# Patient Record
Sex: Female | Born: 1945
Health system: Southern US, Community
[De-identification: ages and names within clinical notes are randomized; demographics above are authoritative.]

## PROBLEM LIST (undated history)

## (undated) DIAGNOSIS — K589 Irritable bowel syndrome without diarrhea: Secondary | ICD-10-CM

## (undated) DIAGNOSIS — M1990 Unspecified osteoarthritis, unspecified site: Secondary | ICD-10-CM

## (undated) DIAGNOSIS — I499 Cardiac arrhythmia, unspecified: Secondary | ICD-10-CM

## (undated) DIAGNOSIS — K760 Fatty (change of) liver, not elsewhere classified: Secondary | ICD-10-CM

## (undated) DIAGNOSIS — K449 Diaphragmatic hernia without obstruction or gangrene: Secondary | ICD-10-CM

## (undated) DIAGNOSIS — Z8719 Personal history of other diseases of the digestive system: Secondary | ICD-10-CM

## (undated) DIAGNOSIS — E119 Type 2 diabetes mellitus without complications: Secondary | ICD-10-CM

## (undated) DIAGNOSIS — K279 Peptic ulcer, site unspecified, unspecified as acute or chronic, without hemorrhage or perforation: Secondary | ICD-10-CM

## (undated) DIAGNOSIS — E559 Vitamin D deficiency, unspecified: Secondary | ICD-10-CM

## (undated) DIAGNOSIS — F419 Anxiety disorder, unspecified: Secondary | ICD-10-CM

## (undated) DIAGNOSIS — D126 Benign neoplasm of colon, unspecified: Secondary | ICD-10-CM

## (undated) DIAGNOSIS — I4891 Unspecified atrial fibrillation: Secondary | ICD-10-CM

## (undated) DIAGNOSIS — N189 Chronic kidney disease, unspecified: Secondary | ICD-10-CM

## (undated) DIAGNOSIS — K648 Other hemorrhoids: Secondary | ICD-10-CM

## (undated) DIAGNOSIS — K219 Gastro-esophageal reflux disease without esophagitis: Secondary | ICD-10-CM

## (undated) DIAGNOSIS — Z8489 Family history of other specified conditions: Secondary | ICD-10-CM

## (undated) DIAGNOSIS — M26609 Unspecified temporomandibular joint disorder, unspecified side: Secondary | ICD-10-CM

## (undated) DIAGNOSIS — M67912 Unspecified disorder of synovium and tendon, left shoulder: Secondary | ICD-10-CM

## (undated) DIAGNOSIS — K8020 Calculus of gallbladder without cholecystitis without obstruction: Secondary | ICD-10-CM

## (undated) DIAGNOSIS — E785 Hyperlipidemia, unspecified: Secondary | ICD-10-CM

## (undated) DIAGNOSIS — F32A Depression, unspecified: Secondary | ICD-10-CM

## (undated) HISTORY — PX: POLYPECTOMY: SHX149

## (undated) HISTORY — DX: Hyperlipidemia, unspecified: E78.5

## (undated) HISTORY — PX: BREAST LUMPECTOMY: SHX2

## (undated) HISTORY — DX: Benign neoplasm of colon, unspecified: D12.6

## (undated) HISTORY — DX: Other hemorrhoids: K64.8

## (undated) HISTORY — PX: KNEE SURGERY: SHX244

## (undated) HISTORY — DX: Diaphragmatic hernia without obstruction or gangrene: K44.9

## (undated) HISTORY — DX: Irritable bowel syndrome, unspecified: K58.9

## (undated) HISTORY — DX: Fatty (change of) liver, not elsewhere classified: K76.0

## (undated) HISTORY — PX: TONSILLECTOMY: SUR1361

## (undated) HISTORY — PX: CHOLECYSTECTOMY: SHX55

## (undated) HISTORY — PX: COLONOSCOPY: SHX174

## (undated) HISTORY — DX: Unspecified temporomandibular joint disorder, unspecified side: M26.609

## (undated) HISTORY — PX: OTHER SURGICAL HISTORY: SHX169

## (undated) HISTORY — PX: HAND SURGERY: SHX662

## (undated) HISTORY — DX: Calculus of gallbladder without cholecystitis without obstruction: K80.20

## (undated) HISTORY — DX: Unspecified atrial fibrillation: I48.91

## (undated) HISTORY — DX: Unspecified osteoarthritis, unspecified site: M19.90

## (undated) HISTORY — DX: Peptic ulcer, site unspecified, unspecified as acute or chronic, without hemorrhage or perforation: K27.9

## (undated) HISTORY — DX: Type 2 diabetes mellitus without complications: E11.9

## (undated) HISTORY — DX: Unspecified disorder of synovium and tendon, left shoulder: M67.912

## (undated) HISTORY — DX: Cardiac arrhythmia, unspecified: I49.9

## (undated) HISTORY — DX: Vitamin D deficiency, unspecified: E55.9

---

## 1981-09-02 HISTORY — PX: PARTIAL HYSTERECTOMY: SHX80

## 1998-05-11 ENCOUNTER — Other Ambulatory Visit: Admission: RE | Admit: 1998-05-11 | Discharge: 1998-05-11 | Payer: Self-pay | Admitting: *Deleted

## 2001-09-02 DIAGNOSIS — D126 Benign neoplasm of colon, unspecified: Secondary | ICD-10-CM

## 2001-09-02 HISTORY — DX: Benign neoplasm of colon, unspecified: D12.6

## 2002-01-08 ENCOUNTER — Emergency Department (HOSPITAL_COMMUNITY): Admission: EM | Admit: 2002-01-08 | Discharge: 2002-01-09 | Payer: Self-pay | Admitting: Emergency Medicine

## 2002-01-09 ENCOUNTER — Encounter: Payer: Self-pay | Admitting: Emergency Medicine

## 2005-06-25 ENCOUNTER — Ambulatory Visit: Payer: Self-pay | Admitting: Internal Medicine

## 2005-08-06 ENCOUNTER — Ambulatory Visit: Payer: Self-pay | Admitting: Internal Medicine

## 2005-08-14 ENCOUNTER — Ambulatory Visit: Payer: Self-pay | Admitting: Internal Medicine

## 2005-10-28 ENCOUNTER — Encounter: Admission: RE | Admit: 2005-10-28 | Discharge: 2005-10-28 | Payer: Self-pay | Admitting: Family Medicine

## 2006-01-09 ENCOUNTER — Ambulatory Visit (HOSPITAL_BASED_OUTPATIENT_CLINIC_OR_DEPARTMENT_OTHER): Admission: RE | Admit: 2006-01-09 | Discharge: 2006-01-09 | Payer: Self-pay | Admitting: Orthopedic Surgery

## 2006-01-09 ENCOUNTER — Encounter (INDEPENDENT_AMBULATORY_CARE_PROVIDER_SITE_OTHER): Payer: Self-pay | Admitting: *Deleted

## 2007-01-07 ENCOUNTER — Encounter: Admission: RE | Admit: 2007-01-07 | Discharge: 2007-01-07 | Payer: Self-pay | Admitting: Family Medicine

## 2007-01-12 ENCOUNTER — Encounter: Admission: RE | Admit: 2007-01-12 | Discharge: 2007-01-12 | Payer: Self-pay | Admitting: Family Medicine

## 2008-01-21 ENCOUNTER — Encounter: Admission: RE | Admit: 2008-01-21 | Discharge: 2008-01-21 | Payer: Self-pay | Admitting: Family Medicine

## 2008-02-01 ENCOUNTER — Encounter: Admission: RE | Admit: 2008-02-01 | Discharge: 2008-02-01 | Payer: Self-pay | Admitting: Family Medicine

## 2009-05-10 ENCOUNTER — Encounter: Admission: RE | Admit: 2009-05-10 | Discharge: 2009-05-10 | Payer: Self-pay | Admitting: Family Medicine

## 2009-05-22 ENCOUNTER — Encounter: Admission: RE | Admit: 2009-05-22 | Discharge: 2009-05-22 | Payer: Self-pay | Admitting: Family Medicine

## 2010-01-01 ENCOUNTER — Encounter: Admission: RE | Admit: 2010-01-01 | Discharge: 2010-01-01 | Payer: Self-pay | Admitting: Family Medicine

## 2010-07-24 ENCOUNTER — Encounter: Admission: RE | Admit: 2010-07-24 | Discharge: 2010-07-24 | Payer: Self-pay | Admitting: Family Medicine

## 2010-09-10 ENCOUNTER — Encounter: Payer: Self-pay | Admitting: Internal Medicine

## 2010-09-23 ENCOUNTER — Encounter: Payer: Self-pay | Admitting: Family Medicine

## 2010-10-04 NOTE — Letter (Signed)
Summary: Colonoscopy Letter  [facility]  [address] [place], [place] [address]   Phone: [phone]  Fax: [phone]      September 10, 2010 MRN: [id number]   Dorothy Mcdonald [address] [place], [place]  [address]   Dear Ms. Faires,   According to your medical record, it is time for you to schedule a Colonoscopy. The American Cancer Society recommends this procedure as a method to detect early colon cancer. Patients with a family history of colon cancer, or a personal history of colon polyps or inflammatory bowel disease are at increased risk.  This letter has been generated based on the recommendations made at the time of your procedure. If you feel that in your particular situation this may no longer apply, please contact our office.  Please call our office at [phone] to schedule this appointment or to update your records at your earliest convenience.  Thank you for cooperating with [place] to provide you with the very best care possible.   Sincerely,  Hedwig Morton. Juanda Chance, M.D.  [facility] [phone]

## 2011-01-18 NOTE — Op Note (Signed)
Dorothy Mcdonald, Dorothy Mcdonald               ACCOUNT NO.:  [id number]   MEDICAL RECORD NO.:  [id number]          PATIENT TYPE:  AMB   LOCATION:  DSC                          FACILITY:  [facility]   PHYSICIAN:  Rodney A. Mortenson, M.D.DATE OF BIRTH:  [birth year]   DATE OF PROCEDURE:  01/09/2006  DATE OF DISCHARGE:                                 OPERATIVE REPORT   JUSTIFICATION:  A 65 year old female with a recurrent triggering finger of  left long finger.  She developed a mass along the flexor sheath.  This is  quite tender and is giving her trouble once again.  She does not wish to  have surgical excision.  She has some symptoms of carpal tunnel but her  nerve conductive studies are negative for carpal tunnel compression.  She  elects not to address the carpal tunnel but just direct to the long finger.   Complications discussed preoperatively, questions answered and encouraged.   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   OPERATION:  Excise cyst from flexor sheath of long finger.   SURGEON:  Lenard Galloway. Chaney Malling, M.D.   ANESTHESIA:  MAC.   PROCEDURE:  The patient was placed on the operative table in the supine  position with a pneumatic tourniquet about the left upper arm.  The left  upper extremity was prepped with DuraPrep and draped down in the usual  manner.  The area of an incision was infiltrated with 1% local Xylocaine.  The arm was then wrapped with an Esmarch, tourniquet was elevated.  Loop  magnification was used throughout.  A Brunner incision made over the flexor  sheath to the long finger.  Skin edges were retracted.  While the digital  nerves were clearly seen in the mid portion of the lumen, this was retracted  and protected out of the operative field.  There is a very large cyst on the  flexor sheath and this is somewhat stenotic in this area.  The sheath was  opened  and a large portion of the sheath was totally removed, which included  the base of the cyst.  Excellent complete decompression of the cyst was  achieved.  The finger could then be moved passively and there was no  catching or locking of the flexor tendons as we moved the sheath.  Skin  edges were then closed with a #4-0 nylon suture.  A large bulky pressure  dressing applied and the patient returned to the recovery room in excellent  condition.  Technically, this procedure went extremely well.   FOLLOWUP CARE:  1.  To the office next week.  2.  Archer Asa and Elita Quick for pain.           ______________________________  Lenard Galloway Chaney Malling, M.D.    RAM/MEDQ  D:  01/09/2006  T:  01/10/2006  Job:  [id number]

## 2011-02-25 ENCOUNTER — Ambulatory Visit (AMBULATORY_SURGERY_CENTER): Payer: BC Managed Care – PPO | Admitting: *Deleted

## 2011-02-25 ENCOUNTER — Encounter: Payer: Self-pay | Admitting: Internal Medicine

## 2011-02-25 VITALS — Ht 65.0 in | Wt 194.2 lb

## 2011-02-25 DIAGNOSIS — Z800 Family history of malignant neoplasm of digestive organs: Secondary | ICD-10-CM

## 2011-02-25 DIAGNOSIS — Z860100 Personal history of colon polyps, unspecified: Secondary | ICD-10-CM

## 2011-02-25 MED ORDER — BISACODYL 5 MG PO TBEC: DELAYED_RELEASE_TABLET | ORAL | Status: DC

## 2011-02-25 MED ORDER — POLYETHYLENE GLYCOL 3350 17 GM/SCOOP PO POWD: 17.0000 g | Freq: Every day | ORAL | Status: AC

## 2011-02-25 MED ORDER — METOCLOPRAMIDE HCL 10 MG PO TABS: ORAL_TABLET | ORAL | Status: DC

## 2011-02-25 NOTE — Progress Notes (Signed)
PATIENT IS REFUSING ALL LARGE VOLUME PREPS SAID SHE WILL THROW THEM UP. SHE REFUSED MOVIPREP AND DID NOT WANT TO CONSENT TO OSMOPREP. WHEN I OFFERED MIRALAX PREP SHE AGREED. STATES SHE DID THAT LAST TIME AND DID FINE. Sherren Kerns

## 2011-03-11 ENCOUNTER — Encounter: Payer: Self-pay | Admitting: Internal Medicine

## 2011-03-11 ENCOUNTER — Ambulatory Visit (AMBULATORY_SURGERY_CENTER): Payer: BC Managed Care – PPO | Admitting: Internal Medicine

## 2011-03-11 DIAGNOSIS — Z86010 Personal history of colonic polyps: Secondary | ICD-10-CM

## 2011-03-11 DIAGNOSIS — D126 Benign neoplasm of colon, unspecified: Secondary | ICD-10-CM

## 2011-03-11 DIAGNOSIS — Z1211 Encounter for screening for malignant neoplasm of colon: Secondary | ICD-10-CM

## 2011-03-11 DIAGNOSIS — Z800 Family history of malignant neoplasm of digestive organs: Secondary | ICD-10-CM

## 2011-03-11 MED ORDER — SODIUM CHLORIDE 0.9 % IV SOLN: 500.0000 mL | INTRAVENOUS | Status: DC

## 2011-03-11 NOTE — Patient Instructions (Signed)
See you colonoscopy findings on the picture page.  Resume your prior medications today.  Please call if any questions or concerns.

## 2011-03-11 NOTE — Progress Notes (Signed)
No complaints while in recovery. MAW

## 2011-03-12 ENCOUNTER — Telehealth: Payer: Self-pay | Admitting: *Deleted

## 2011-03-12 NOTE — Telephone Encounter (Signed)

## 2011-03-16 ENCOUNTER — Encounter: Payer: Self-pay | Admitting: Internal Medicine

## 2011-05-21 ENCOUNTER — Telehealth: Payer: Self-pay | Admitting: Internal Medicine

## 2011-05-21 NOTE — Telephone Encounter (Signed)
Spoke with patient and gave her the results of colonoscopy. Mailed her the letter again after verifying the address.

## 2011-08-30 ENCOUNTER — Other Ambulatory Visit: Payer: Self-pay | Admitting: Family Medicine

## 2011-08-30 DIAGNOSIS — R920 Mammographic microcalcification found on diagnostic imaging of breast: Secondary | ICD-10-CM

## 2011-09-11 ENCOUNTER — Other Ambulatory Visit: Payer: Self-pay | Admitting: Family Medicine

## 2011-09-11 ENCOUNTER — Ambulatory Visit
Admission: RE | Admit: 2011-09-11 | Discharge: 2011-09-11 | Disposition: A | Discharge status: Home / Self Care | Payer: BC Managed Care – PPO | Source: Ambulatory Visit | Attending: Family Medicine | Admitting: Family Medicine

## 2011-09-11 DIAGNOSIS — R920 Mammographic microcalcification found on diagnostic imaging of breast: Secondary | ICD-10-CM

## 2011-09-11 DIAGNOSIS — N6001 Solitary cyst of right breast: Secondary | ICD-10-CM

## 2012-09-22 ENCOUNTER — Other Ambulatory Visit: Payer: Self-pay | Admitting: Family Medicine

## 2012-09-22 DIAGNOSIS — Z1231 Encounter for screening mammogram for malignant neoplasm of breast: Secondary | ICD-10-CM

## 2012-10-16 ENCOUNTER — Ambulatory Visit: Payer: BC Managed Care – PPO

## 2012-11-10 ENCOUNTER — Ambulatory Visit
Admission: RE | Admit: 2012-11-10 | Discharge: 2012-11-10 | Disposition: A | Discharge status: Home / Self Care | Payer: BC Managed Care – PPO | Source: Ambulatory Visit | Attending: Family Medicine | Admitting: Family Medicine

## 2012-11-10 DIAGNOSIS — Z1231 Encounter for screening mammogram for malignant neoplasm of breast: Secondary | ICD-10-CM

## 2013-12-29 ENCOUNTER — Other Ambulatory Visit: Payer: Self-pay

## 2013-12-29 DIAGNOSIS — Z1231 Encounter for screening mammogram for malignant neoplasm of breast: Secondary | ICD-10-CM

## 2014-01-06 ENCOUNTER — Ambulatory Visit: Payer: BC Managed Care – PPO

## 2014-01-17 ENCOUNTER — Encounter (INDEPENDENT_AMBULATORY_CARE_PROVIDER_SITE_OTHER): Payer: Self-pay

## 2014-01-17 ENCOUNTER — Ambulatory Visit
Admission: RE | Admit: 2014-01-17 | Discharge: 2014-01-17 | Disposition: A | Discharge status: Home / Self Care | Payer: Medicare Other | Source: Ambulatory Visit

## 2014-01-17 DIAGNOSIS — Z1231 Encounter for screening mammogram for malignant neoplasm of breast: Secondary | ICD-10-CM

## 2014-03-11 ENCOUNTER — Other Ambulatory Visit: Payer: Self-pay | Admitting: Family Medicine

## 2014-03-11 DIAGNOSIS — R1011 Right upper quadrant pain: Secondary | ICD-10-CM

## 2014-03-14 ENCOUNTER — Ambulatory Visit
Admission: RE | Admit: 2014-03-14 | Discharge: 2014-03-14 | Disposition: A | Discharge status: Home / Self Care | Payer: Medicare Other | Source: Ambulatory Visit | Attending: Family Medicine | Admitting: Family Medicine

## 2014-03-14 DIAGNOSIS — R1011 Right upper quadrant pain: Secondary | ICD-10-CM

## 2014-03-17 ENCOUNTER — Encounter: Payer: Self-pay | Admitting: Internal Medicine

## 2014-03-18 ENCOUNTER — Encounter: Payer: Self-pay | Admitting: *Deleted

## 2014-04-23 ENCOUNTER — Encounter: Payer: Self-pay | Admitting: Internal Medicine

## 2014-05-31 ENCOUNTER — Ambulatory Visit (INDEPENDENT_AMBULATORY_CARE_PROVIDER_SITE_OTHER): Payer: Medicare Other | Admitting: Internal Medicine

## 2014-05-31 ENCOUNTER — Encounter: Payer: Self-pay | Admitting: Internal Medicine

## 2014-05-31 ENCOUNTER — Other Ambulatory Visit (INDEPENDENT_AMBULATORY_CARE_PROVIDER_SITE_OTHER): Payer: Medicare Other

## 2014-05-31 VITALS — BP 126/78 | HR 68 | Ht 64.75 in | Wt 190.0 lb

## 2014-05-31 DIAGNOSIS — K760 Fatty (change of) liver, not elsewhere classified: Secondary | ICD-10-CM

## 2014-05-31 DIAGNOSIS — R1011 Right upper quadrant pain: Secondary | ICD-10-CM

## 2014-05-31 DIAGNOSIS — K7689 Other specified diseases of liver: Secondary | ICD-10-CM

## 2014-05-31 LAB — CREATININE, SERUM: Creatinine, Ser: 0.8 mg/dL (ref 0.4–1.2)

## 2014-05-31 LAB — HEPATIC FUNCTION PANEL
ALBUMIN: 3.9 g/dL (ref 3.5–5.2)
ALK PHOS: 83 U/L (ref 39–117)
ALT: 22 U/L (ref 0–35)
AST: 24 U/L (ref 0–37)
BILIRUBIN DIRECT: 0 mg/dL (ref 0.0–0.3)
Total Bilirubin: 0.5 mg/dL (ref 0.2–1.2)
Total Protein: 7.3 g/dL (ref 6.0–8.3)

## 2014-05-31 LAB — BUN: BUN: 21 mg/dL (ref 6–23)

## 2014-05-31 MED ORDER — RANITIDINE HCL 300 MG PO TABS: 300.0000 mg | ORAL_TABLET | Freq: Every day | ORAL | Status: DC

## 2014-05-31 NOTE — Patient Instructions (Addendum)
You have been scheduled for a CT scan of the abdomen and pelvis at [facility] ([address]---this is in the same building as Press [facility]).   You are scheduled on 06/09/14 at 8:30 am. You should arrive 15 minutes prior to your appointment time for registration. Please follow the written instructions below on the day of your exam:  WARNING: IF YOU ARE ALLERGIC TO IODINE/X-RAY DYE, PLEASE NOTIFY RADIOLOGY IMMEDIATELY AT [phone]! YOU WILL BE GIVEN A 13 HOUR PREMEDICATION PREP.  1) Do not eat or drink anything after 4:30 am (4 hours prior to your test) 2) You have been given 2 bottles of oral contrast to drink. The solution may taste better if refrigerated, but do NOT add ice or any other liquid to this solution. Shake well before drinking.    Drink 1 bottle of contrast @ 6:30 am (2 hours prior to your exam)  Drink 1 bottle of contrast @ 7:30 am (1 hour prior to your exam)  You may take any medications as prescribed with a small amount of water except for the following: Metformin, Glucophage, Glucovance, Avandamet, Riomet, Fortamet, Actoplus Met, Janumet, Glumetza or Metaglip. The above medications must be held the day of the exam AND 48 hours after the exam.  The purpose of you drinking the oral contrast is to aid in the visualization of your intestinal tract. The contrast solution may cause some diarrhea. Before your exam is started, you will be given a small amount of fluid to drink. Depending on your individual set of symptoms, you may also receive an intravenous injection of x-ray contrast/dye. Plan on being at [facility] for 30 minutes or long, depending on the type of exam you are having performed.  This test typically takes 30-45 minutes to complete.  If you have any questions regarding your exam or if you need to reschedule, you may call the CT department at [phone] between the hours of 8:00 am and 5:00 pm,  Monday-Friday.  ________________________________________________________________________ We have sent the following medications to your pharmacy for you to pick up at your convenience: Ranitidine  Your physician has requested that you go to the basement for the following lab work before leaving today: LFT's, BUN, Creatinine  CC:Dr Claris Gower

## 2014-05-31 NOTE — Progress Notes (Signed)
Dorothy Mcdonald 07-13-1946 [id number]  Note: This dictation was prepared with Dragon digital system. Any transcriptional errors that result from this procedure are unintentional.   History of Present Illness:  This is a 68 year old white female with chronic right upper quadrant abdominal pain which started shortly after her laparoscopic cholecystectomy about 7 years ago. She has a chronically inflamed gallbladder but otherwise, an uncomplicated hospitalization. The pain is worse when she is constipated or after a large meal. She has known fatty liver on an upper abdominal ultrasound from July 2015. She denies heartburn or indigestion. She denies taking any laxatives but complains of not being able to evacuate, having pasty soft stools. She had a colonoscopy in 2003 when she had a tubular adenoma and hyperplastic polyp removed.. In 2006, she had a normal exam and in July 2012, she had a cecal tubular adenoma. Her father had colon cancer.    Past Medical History  Diagnosis Date  . Irregular heart beat   . Hyperlipidemia   . Vitamin D deficiency   . Adenomatous colon polyp 2003  . Hepatic steatosis   . TMJ (temporomandibular joint disorder)   . Arthritis   . Gallstones   . IBS (irritable bowel syndrome)     Past Surgical History  Procedure Laterality Date  . Polypectomy    . Colonoscopy    . Cholecystectomy    . Partial hysterectomy  1983  . Rectocele and cystocele repair    . Breast lumpectomy Right   . Tonsillectomy      No Known Allergies  Family history and social history have been reviewed.  Review of Systems: Right upper quadrant abdominal pain  The remainder of the 10 point ROS is negative except as outlined in the H&P  Physical Exam: General Appearance Well developed, in no distress, mildly overweight Eyes  Non icteric  HEENT  Non traumatic, normocephalic  Mouth No lesion, tongue papillated, no cheilosis Neck Supple without adenopathy, thyroid not enlarged, no  carotid bruits, no JVD Lungs Clear to auscultation bilaterally COR Normal S1, normal S2, regular rhythm, no murmur, quiet precordium Abdomen soft but tender in the small area along right costal margin rather tender on palpation. I cannot appreciate any hernia. Liver edge is at the costal margin. There is no ascites. Bowel sounds are active Rectal small amount of soft Hemoccult negative stool Extremities  No pedal edema Skin No lesions Neurological Alert and oriented x 3 Psychological Normal mood and affect  Assessment and Plan:   Problem #93 68 year old white female with chronic right upper quadrant abdominal discomfort localized to a small area along the right costal margin which could be originating from the abdominal wall but also could be due to adhesions or possibly due to a ventral hernia. We will proceed with a CT scan of the abdomen with attention to the right upper quadrant. I will also start her on ranitidine 300 mg at bedtime because of her dyspepsia and associated nausea. Because of the constipation, she will start a probiotic, one daily. She will be due for a recall colonoscopy in July 2017. We will check liver function tests today.  Problem #2 Positive family history of colon cancer in patient's father. A recall colonoscopy will be due in July 2017.    Delfin Edis 05/31/2014

## 2014-06-09 ENCOUNTER — Telehealth: Payer: Self-pay | Admitting: *Deleted

## 2014-06-09 ENCOUNTER — Ambulatory Visit (INDEPENDENT_AMBULATORY_CARE_PROVIDER_SITE_OTHER)
Admission: RE | Admit: 2014-06-09 | Discharge: 2014-06-09 | Disposition: A | Discharge status: Home / Self Care | Payer: Medicare Other | Source: Ambulatory Visit | Attending: Internal Medicine | Admitting: Internal Medicine

## 2014-06-09 DIAGNOSIS — R1011 Right upper quadrant pain: Secondary | ICD-10-CM

## 2014-06-09 MED ORDER — IOHEXOL 300 MG/ML  SOLN
100.0000 mL | Freq: Once | INTRAMUSCULAR | Status: AC | PRN
  Administered 2014-06-09: 100 mL via INTRAVENOUS

## 2014-06-09 NOTE — Telephone Encounter (Signed)
Lattie Haw with [facility] called to let us know patient was able to drink some of the water based contrast and they went ahead with the CT. They did the best they could.

## 2014-06-09 NOTE — Telephone Encounter (Signed)
Lattie Haw with [facility] called to let Dr. Olevia Perches know that this patient vomited her oral contrast. Lattie Haw is going to have the patient try water based contrast and then scan her.

## 2015-07-04 ENCOUNTER — Other Ambulatory Visit: Payer: Self-pay

## 2015-07-04 DIAGNOSIS — Z1231 Encounter for screening mammogram for malignant neoplasm of breast: Secondary | ICD-10-CM

## 2015-07-07 ENCOUNTER — Ambulatory Visit: Payer: Self-pay

## 2015-08-03 ENCOUNTER — Ambulatory Visit
Admission: RE | Admit: 2015-08-03 | Discharge: 2015-08-03 | Disposition: A | Discharge status: Home / Self Care | Payer: Medicare Other | Source: Ambulatory Visit

## 2015-08-03 DIAGNOSIS — Z1231 Encounter for screening mammogram for malignant neoplasm of breast: Secondary | ICD-10-CM

## 2016-01-31 ENCOUNTER — Other Ambulatory Visit: Payer: Self-pay | Admitting: Family Medicine

## 2016-01-31 ENCOUNTER — Other Ambulatory Visit: Payer: Self-pay

## 2016-01-31 ENCOUNTER — Ambulatory Visit (HOSPITAL_COMMUNITY): Payer: Medicare Other | Attending: Cardiovascular Disease

## 2016-01-31 DIAGNOSIS — I482 Chronic atrial fibrillation, unspecified: Secondary | ICD-10-CM

## 2016-01-31 DIAGNOSIS — I517 Cardiomegaly: Secondary | ICD-10-CM | POA: Insufficient documentation

## 2016-01-31 DIAGNOSIS — I4891 Unspecified atrial fibrillation: Secondary | ICD-10-CM | POA: Diagnosis present

## 2016-01-31 DIAGNOSIS — Z87891 Personal history of nicotine dependence: Secondary | ICD-10-CM | POA: Insufficient documentation

## 2016-01-31 DIAGNOSIS — E785 Hyperlipidemia, unspecified: Secondary | ICD-10-CM | POA: Insufficient documentation

## 2016-01-31 DIAGNOSIS — I4892 Unspecified atrial flutter: Secondary | ICD-10-CM | POA: Insufficient documentation

## 2016-02-14 ENCOUNTER — Encounter: Payer: Self-pay | Admitting: Gastroenterology

## 2016-03-19 ENCOUNTER — Ambulatory Visit (HOSPITAL_BASED_OUTPATIENT_CLINIC_OR_DEPARTMENT_OTHER): Payer: Medicare Other | Attending: Family Medicine | Admitting: Internal Medicine

## 2016-03-19 VITALS — Ht 65.0 in | Wt 197.0 lb

## 2016-03-19 DIAGNOSIS — Z6833 Body mass index (BMI) 33.0-33.9, adult: Secondary | ICD-10-CM | POA: Diagnosis not present

## 2016-03-19 DIAGNOSIS — E669 Obesity, unspecified: Secondary | ICD-10-CM | POA: Diagnosis not present

## 2016-03-19 DIAGNOSIS — R5383 Other fatigue: Secondary | ICD-10-CM | POA: Insufficient documentation

## 2016-03-19 DIAGNOSIS — I4891 Unspecified atrial fibrillation: Secondary | ICD-10-CM | POA: Insufficient documentation

## 2016-03-19 DIAGNOSIS — Z79899 Other long term (current) drug therapy: Secondary | ICD-10-CM | POA: Diagnosis not present

## 2016-03-19 DIAGNOSIS — R0683 Snoring: Secondary | ICD-10-CM | POA: Insufficient documentation

## 2016-03-24 DIAGNOSIS — I4891 Unspecified atrial fibrillation: Secondary | ICD-10-CM | POA: Diagnosis not present

## 2016-03-24 NOTE — Procedures (Signed)
   Patient Name: Dorothy Mcdonald, Dorothy Mcdonald Date: 03/19/2016 Gender: Female D.O.B: 1946/06/27 Age (years): 69 Referring Provider: Claris Gower Height (inches): 54 Interpreting Physician: Baird Lyons MD, ABSM Weight (lbs): 197 RPSGT: Laren Everts BMI: 33 MRN: [id number] Neck Size: 13.00 CLINICAL INFORMATION Sleep Study Type: NPSG Indication for sleep study: Fatigue, Obesity Epworth Sleepiness Score: 8 SLEEP STUDY TECHNIQUE As per the AASM Manual for the Scoring of Sleep and Associated Events v2.3 (April 2016) with a hypopnea requiring 4% desaturations. The channels recorded and monitored were frontal, central and occipital EEG, electrooculogram (EOG), submentalis EMG (chin), nasal and oral airflow, thoracic and abdominal wall motion, anterior tibialis EMG, snore microphone, electrocardiogram, and pulse oximetry. MEDICATIONS Patient's medications include: charted for review Medications self-administered by patient during sleep study : DIPHENHYDRAMINE. SLEEP ARCHITECTURE The study was initiated at 10:43:13 PM and ended at 4:53:15 AM. Sleep onset time was 50.1 minutes and the sleep efficiency was 57.6%. The total sleep time was 213.0 minutes. Stage REM latency was N/A minutes. The patient spent 15.02% of the night in stage N1 sleep, 84.98% in stage N2 sleep, 0.00% in stage N3 and 0.00% in REM. Alpha intrusion was absent. Supine sleep was 25.12%. Wake after sleep onset 107 minutes RESPIRATORY PARAMETERS The overall apnea/hypopnea index (AHI) was 0.8 per hour. There were 3 total apneas, including 1 obstructive, 2 central and 0 mixed apneas. There were 0 hypopneas and 17 RERAs. The AHI during Stage REM sleep was N/A per hour. AHI while supine was 2.2 per hour. The mean oxygen saturation was 95.91%. The minimum SpO2 during sleep was 92.00%. Soft snoring was noted during this study. CARDIAC DATA The 2 lead EKG demonstrated sinus rhythm. The mean heart rate was 58.52 beats per minute.  Other EKG findings include: PACs. LEG MOVEMENT DATA The total PLMS were 0 with a resulting PLMS index of 0.00. Associated arousal with leg movement index was 0.0 . IMPRESSIONS - No significant obstructive sleep apnea occurred during this study (AHI = 0.8/h). - No significant central sleep apnea occurred during this study (CAI = 0.6/h). - The patient had minimal or no oxygen desaturation during the study (Min O2 = 92.00%) - The patient snored with Soft snoring volume. - No cardiac abnormalities were noted during this study. Rhythm was sinus with occasional PAC. - Clinically significant periodic limb movements did not occur during sleep. No significant associated arousals. - test 2 of template DIAGNOSIS - Normal study RECOMMENDATIONS - Avoid alcohol, sedatives and other CNS depressants that may worsen sleep apnea and disrupt normal sleep architecture. - Sleep hygiene should be reviewed to assess factors that may improve sleep quality. - Weight management and regular exercise should be initiated or continued if appropriate.  [Electronically signed] 03/24/2016 05:05 PM  Baird Lyons MD, [place], American Board of Sleep Medicine   NPI: [id number]  [place], American Board of Sleep Medicine  ELECTRONICALLY SIGNED ON:  03/24/2016, 5:02 PM [place] PH: [phone]   FX: [phone] [place]

## 2016-08-31 ENCOUNTER — Encounter (HOSPITAL_COMMUNITY): Payer: Self-pay | Admitting: Emergency Medicine

## 2016-08-31 ENCOUNTER — Emergency Department (HOSPITAL_COMMUNITY)
Admission: EM | Admit: 2016-08-31 | Discharge: 2016-08-31 | Disposition: A | Discharge status: Home / Self Care | Payer: Medicare Other | Attending: Emergency Medicine | Admitting: Emergency Medicine

## 2016-08-31 ENCOUNTER — Emergency Department (HOSPITAL_COMMUNITY): Payer: Medicare Other

## 2016-08-31 DIAGNOSIS — R1013 Epigastric pain: Secondary | ICD-10-CM | POA: Diagnosis present

## 2016-08-31 DIAGNOSIS — Z7982 Long term (current) use of aspirin: Secondary | ICD-10-CM | POA: Diagnosis not present

## 2016-08-31 DIAGNOSIS — K279 Peptic ulcer, site unspecified, unspecified as acute or chronic, without hemorrhage or perforation: Secondary | ICD-10-CM | POA: Diagnosis not present

## 2016-08-31 DIAGNOSIS — K219 Gastro-esophageal reflux disease without esophagitis: Secondary | ICD-10-CM

## 2016-08-31 DIAGNOSIS — Z7901 Long term (current) use of anticoagulants: Secondary | ICD-10-CM | POA: Insufficient documentation

## 2016-08-31 DIAGNOSIS — Z87891 Personal history of nicotine dependence: Secondary | ICD-10-CM | POA: Diagnosis not present

## 2016-08-31 LAB — COMPREHENSIVE METABOLIC PANEL
ALT: 22 U/L (ref 14–54)
ANION GAP: 3 — AB (ref 5–15)
AST: 24 U/L (ref 15–41)
Albumin: 3.3 g/dL — ABNORMAL LOW (ref 3.5–5.0)
Alkaline Phosphatase: 85 U/L (ref 38–126)
BUN: 14 mg/dL (ref 6–20)
CHLORIDE: 111 mmol/L (ref 101–111)
CO2: 26 mmol/L (ref 22–32)
CREATININE: 0.76 mg/dL (ref 0.44–1.00)
Calcium: 9 mg/dL (ref 8.9–10.3)
Glucose, Bld: 111 mg/dL — ABNORMAL HIGH (ref 65–99)
POTASSIUM: 3.6 mmol/L (ref 3.5–5.1)
SODIUM: 140 mmol/L (ref 135–145)
Total Bilirubin: 0.3 mg/dL (ref 0.3–1.2)
Total Protein: 6.7 g/dL (ref 6.5–8.1)

## 2016-08-31 LAB — URINALYSIS, ROUTINE W REFLEX MICROSCOPIC
Bacteria, UA: NONE SEEN
Bilirubin Urine: NEGATIVE
GLUCOSE, UA: NEGATIVE mg/dL
KETONES UR: NEGATIVE mg/dL
Nitrite: NEGATIVE
PH: 5 (ref 5.0–8.0)
Protein, ur: NEGATIVE mg/dL
Specific Gravity, Urine: 1.031 — ABNORMAL HIGH (ref 1.005–1.030)

## 2016-08-31 LAB — CBC
HCT: 43.8 % (ref 36.0–46.0)
HEMOGLOBIN: 14.8 g/dL (ref 12.0–15.0)
MCH: 29.5 pg (ref 26.0–34.0)
MCHC: 33.8 g/dL (ref 30.0–36.0)
MCV: 87.3 fL (ref 78.0–100.0)
PLATELETS: 245 10*3/uL (ref 150–400)
RBC: 5.02 MIL/uL (ref 3.87–5.11)
RDW: 13.5 % (ref 11.5–15.5)
WBC: 11.4 10*3/uL — AB (ref 4.0–10.5)

## 2016-08-31 LAB — LIPASE, BLOOD: LIPASE: 20 U/L (ref 11–51)

## 2016-08-31 LAB — PROTIME-INR
INR: 2.22
Prothrombin Time: 25 seconds — ABNORMAL HIGH (ref 11.4–15.2)

## 2016-08-31 MED ORDER — FAMOTIDINE IN NACL 20-0.9 MG/50ML-% IV SOLN
20.0000 mg | Freq: Once | INTRAVENOUS | Status: AC
  Administered 2016-08-31: 20 mg via INTRAVENOUS
  Filled 2016-08-31: qty 50

## 2016-08-31 MED ORDER — SODIUM CHLORIDE 0.9 % IV BOLUS (SEPSIS)
1000.0000 mL | Freq: Once | INTRAVENOUS | Status: AC
  Administered 2016-08-31: 1000 mL via INTRAVENOUS

## 2016-08-31 MED ORDER — FAMOTIDINE 20 MG PO TABS: 20.0000 mg | ORAL_TABLET | Freq: Two times a day (BID) | ORAL | 0 refills | Status: DC

## 2016-08-31 MED ORDER — ONDANSETRON HCL 4 MG/2ML IJ SOLN
4.0000 mg | Freq: Once | INTRAMUSCULAR | Status: AC
Start: 2016-08-31 — End: 2016-08-31
  Administered 2016-08-31: 4 mg via INTRAVENOUS
  Filled 2016-08-31: qty 2

## 2016-08-31 MED ORDER — SUCRALFATE 1 G PO TABS: 1.0000 g | ORAL_TABLET | Freq: Three times a day (TID) | ORAL | 0 refills | Status: DC

## 2016-08-31 MED ORDER — ONDANSETRON HCL 4 MG PO TABS: 4.0000 mg | ORAL_TABLET | Freq: Four times a day (QID) | ORAL | 0 refills | Status: DC

## 2016-08-31 NOTE — ED Notes (Signed)
Pt. Requesting some ice chips, stated, no vomiting just some acid reflux

## 2016-08-31 NOTE — ED Provider Notes (Signed)
[facility] DEPT Provider Note   CSN: [id number] Arrival date & time: 08/31/16  [id number]     History   Chief Complaint Chief Complaint  Patient presents with  . Constipation  . Emesis    HPI Dorothy Mcdonald is a 70 y.o. female.  Patient is a 70 year old female with a history of A. fib on chronic Coumadin therapy, IBS presenting today with 6 days of persistent epigastric pain with nausea and intermittent vomiting. Patient states on Christmas  is when her symptoms started however they have not improved.  She initially felt that she was constipated however she took a laxative on Tuesday and had a very large bowel movement. She also had an episode of emesis on Tuesday. Since that time she's had constant epigastric discomfort, persistent nausea but only occasional vomiting. Patient last vomited last night and noted she had blood in her vomitus. It was not all blood but there were clots of blood in it. She is also had a very foul taste in her mouth which is worst in the morning when she wakes up. She currently takes no medication for reflux and has no prior history of peptic ulcers. She denies any NSAID use and does not drink alcohol regularly. Eating seems to make the pain a little bit worse. She has never had an endoscopy done but sounds like she had an upper GI done about a year ago which was normal. She has really not had any symptoms like this for about a year and they have never been this severe.  This is also the first episode of hematemesis and no bowel movement since Tuesday the patient has only had minimal to eat. She is still passing gas. She also notices her urine is dark but denies significant frequency, urgency or dysuria..   The history is provided by the patient.  Constipation    Emesis      Past Medical History:  Diagnosis Date  . Adenomatous colon polyp 2003  . Arthritis   . Gallstones   . Hepatic steatosis   . Hyperlipidemia   . IBS (irritable bowel syndrome)   .  Irregular heart beat   . TMJ (temporomandibular joint disorder)   . Vitamin D deficiency     There are no active problems to display for this patient.   Past Surgical History:  Procedure Laterality Date  . BREAST LUMPECTOMY Right   . CHOLECYSTECTOMY    . COLONOSCOPY    . PARTIAL HYSTERECTOMY  1983  . POLYPECTOMY    . RECTOCELE AND CYSTOCELE REPAIR    . TONSILLECTOMY      OB History    No data available       Home Medications    Prior to Admission medications   Medication Sig Start Date End Date Taking? Authorizing Provider  atenolol (TENORMIN) 50 MG tablet Take 25 mg by mouth 2 (two) times daily.   Yes Historical Provider, MD  Magnesium 100 MG CAPS Take 100 mg by mouth daily.   Yes Historical Provider, MD  ranitidine (ZANTAC) 300 MG tablet Take 1 tablet (300 mg total) by mouth at bedtime. 05/31/14  Yes Lafayette Dragon, MD  topiramate (TOPAMAX) 100 MG tablet Take 100 mg by mouth daily.   Yes Historical Provider, MD  Vitamin D, Ergocalciferol, (DRISDOL) 50000 units CAPS capsule Take 50,000 Units by mouth every 7 (seven) days. Saturday   Yes Historical Provider, MD  warfarin (COUMADIN) 6 MG tablet Take 6 mg by mouth daily.   Yes  Historical Provider, MD  aspirin 500 MG tablet Take 500 mg by mouth 2 (two) times daily. FOR TMJ    Historical Provider, MD  aspirin 81 MG tablet Take 81 mg by mouth daily.    Historical Provider, MD  Cinnamon 500 MG TABS Take 2 tablets by mouth daily.    Historical Provider, MD  phentermine 37.5 MG capsule Take 37.5 mg by mouth daily. Takes 1/2-1 daily    Historical Provider, MD  propranolol (INDERAL) 80 MG tablet Take 40-80 mg by mouth every 8 (eight) hours. Pt takes 1 tablet at night 02/25/14   Historical Provider, MD    Family History Family History  Problem Relation Age of Onset  . Colon cancer Father     DIED AT 64  . Colon cancer Paternal Aunt   . Colon cancer Paternal Uncle     x 3  . Ovarian cancer Mother   . Uterine cancer Mother   .  Breast cancer Sister     Social History Social History  Substance Use Topics  . Smoking status: Former Smoker    Types: Cigarettes    Quit date: 09/02/1984  . Smokeless tobacco: Never Used  . Alcohol use Yes     Comment: MONTHLY     Allergies   Penicillins   Review of Systems Review of Systems  Gastrointestinal: Positive for constipation and vomiting.  All other systems reviewed and are negative.    Physical Exam Updated Vital Signs BP 164/74 (BP Location: Left Arm)   Pulse 95   Temp 97.9 F (36.6 C) (Oral)   Resp 16   Ht 5' 5.5" (1.664 m)   Wt 199 lb 11.2 oz (90.6 kg)   SpO2 91%   BMI 32.73 kg/m   Physical Exam  Constitutional: She is oriented to person, place, and time. She appears well-developed and well-nourished. No distress.  HENT:  Head: Normocephalic and atraumatic.  Mouth/Throat: Oropharynx is clear and moist. Mucous membranes are dry.  Eyes: Conjunctivae and EOM are normal. Pupils are equal, round, and reactive to light.  Neck: Normal range of motion. Neck supple.  Cardiovascular: Normal rate, regular rhythm and intact distal pulses.   No murmur heard. Pulmonary/Chest: Effort normal and breath sounds normal. No respiratory distress. She has no wheezes. She has no rales.  Abdominal: Soft. She exhibits no distension. There is tenderness in the epigastric area. There is no rebound and no guarding.  Musculoskeletal: Normal range of motion. She exhibits no edema or tenderness.  Neurological: She is alert and oriented to person, place, and time.  Skin: Skin is warm and dry. No rash noted. No erythema.  Psychiatric: She has a normal mood and affect. Her behavior is normal.  Nursing note and vitals reviewed.    ED Treatments / Results  Labs (all labs ordered are listed, but only abnormal results are displayed) Labs Reviewed  COMPREHENSIVE METABOLIC PANEL - Abnormal; Notable for the following:       Result Value   Glucose, Bld 111 (*)    Albumin 3.3 (*)     Anion gap 3 (*)    All other components within normal limits  CBC - Abnormal; Notable for the following:    WBC 11.4 (*)    All other components within normal limits  URINALYSIS, ROUTINE W REFLEX MICROSCOPIC - Abnormal; Notable for the following:    APPearance HAZY (*)    Specific Gravity, Urine 1.031 (*)    Hgb urine dipstick SMALL (*)  Leukocytes, UA TRACE (*)    Squamous Epithelial / LPF 0-5 (*)    Crystals PRESENT (*)    All other components within normal limits  PROTIME-INR - Abnormal; Notable for the following:    Prothrombin Time 25.0 (*)    All other components within normal limits  LIPASE, BLOOD    EKG  EKG Interpretation None       Radiology Dg Abdomen Acute W/chest  Result Date: 08/31/2016 CLINICAL DATA:  Constipation, emesis and indigestion x5 days. Epigastric pain x5 days. Productive cough in the past week, but pt states it has mostly cleared up. Hx of cholecystectomy, palpitations, gallstones, IBS. EXAM: DG ABDOMEN ACUTE W/ 1V CHEST COMPARISON:  CT, 06/09/2014 FINDINGS: Normal bowel gas pattern. No obstruction, generalized adynamic ileus or free air. Status post cholecystectomy. No evidence of renal or ureteral stones. Heart, mediastinum hila are unremarkable. Lungs are essentially clear. Skeletal structures are intact. Mild-to-moderate lumbar levoscoliosis. IMPRESSION: 1. No acute findings.  No evidence of bowel obstruction or free air. 2. No acute cardiopulmonary disease. Electronically Signed   By: Lajean Manes M.D.   On: 08/31/2016 10:52    Procedures Procedures (including critical care time)  Medications Ordered in ED Medications  ondansetron (ZOFRAN) injection 4 mg (4 mg Intravenous Given 08/31/16 1152)  sodium chloride 0.9 % bolus 1,000 mL (0 mLs Intravenous Stopped 08/31/16 1304)  famotidine (PEPCID) IVPB 20 mg premix (0 mg Intravenous Stopped 08/31/16 1304)     Initial Impression / Assessment and Plan / ED Course  I have reviewed the triage  vital signs and the nursing notes.  Pertinent labs & imaging results that were available during my care of the patient were reviewed by me and considered in my medical decision making (see chart for details).  Clinical Course    Patient presenting here with 6 days of epigastric pain intermittent vomiting and nausea. Patient is nontoxic appearing on exam without peritoneal signs. She does have epigastric tenderness without guarding or rebound. Patient has had symptoms similar to this in the past but never this severe. Patient does take Coumadin for atrial fibrillation and last INR was checked within the last few weeks and was therapeutic. She has only had one episode of vomiting that had some blood in it. Sounds based on patient's description it with some small clots but not all blood. Her last bowel movement was Tuesday that may be because she hasn't been eating. Patient is status post cholecystectomy and low suspicion for pancreatitis. Concern for possible peptic ulcer disease but with suspicion of perforation.  Patient does not use NSAIDs or alcohol.  CBC, CMP, UA, lipase are all within normal limits. Patient given IV fluids, Zofran and Pepcid.  KUB without any evidence of obstruction and a normal bowel gas pattern  1:30 PM INR 2.2.  Patient feeling a little better after medications. Recommended that patient follow-up with her GI doctor early next week for evaluation and possible endoscopy. She was started on a PPI, Carafate and given nausea control. Family is present and everybody is agreeable to the plan. She was given return precautions.  Final Clinical Impressions(s) / ED Diagnoses   Final diagnoses:  PUD (peptic ulcer disease)  Gastroesophageal reflux disease without esophagitis    New Prescriptions New Prescriptions   FAMOTIDINE (PEPCID) 20 MG TABLET    Take 1 tablet (20 mg total) by mouth 2 (two) times daily.   ONDANSETRON (ZOFRAN) 4 MG TABLET    Take 1 tablet (4 mg total) by mouth  every 6 (six) hours.   SUCRALFATE (CARAFATE) 1 G TABLET    Take 1 tablet (1 g total) by mouth 4 (four) times daily -  with meals and at bedtime.     Blanchie Dessert, MD 08/31/16 [phone]

## 2016-08-31 NOTE — ED Triage Notes (Signed)
Pt c/o vomiting started last night with bright red blood in emesis. Has been constipated-- no BM since Tuesday night-- states was having "stool " taste in mouth-- c/o epigastric pain--

## 2016-09-01 LAB — URINE CULTURE

## 2016-09-09 DIAGNOSIS — M2550 Pain in unspecified joint: Secondary | ICD-10-CM | POA: Diagnosis not present

## 2016-09-09 DIAGNOSIS — E669 Obesity, unspecified: Secondary | ICD-10-CM | POA: Diagnosis not present

## 2016-09-09 DIAGNOSIS — M25512 Pain in left shoulder: Secondary | ICD-10-CM | POA: Diagnosis not present

## 2016-09-09 DIAGNOSIS — M25511 Pain in right shoulder: Secondary | ICD-10-CM | POA: Diagnosis not present

## 2016-09-09 DIAGNOSIS — Z6832 Body mass index (BMI) 32.0-32.9, adult: Secondary | ICD-10-CM | POA: Diagnosis not present

## 2016-09-23 ENCOUNTER — Other Ambulatory Visit: Payer: Self-pay | Admitting: Physician Assistant

## 2016-09-23 DIAGNOSIS — M25511 Pain in right shoulder: Secondary | ICD-10-CM

## 2016-09-23 DIAGNOSIS — Z6832 Body mass index (BMI) 32.0-32.9, adult: Secondary | ICD-10-CM | POA: Diagnosis not present

## 2016-09-23 DIAGNOSIS — M25512 Pain in left shoulder: Secondary | ICD-10-CM | POA: Diagnosis not present

## 2016-09-23 DIAGNOSIS — M2550 Pain in unspecified joint: Secondary | ICD-10-CM | POA: Diagnosis not present

## 2016-09-23 DIAGNOSIS — E669 Obesity, unspecified: Secondary | ICD-10-CM | POA: Diagnosis not present

## 2016-09-30 ENCOUNTER — Ambulatory Visit
Admission: RE | Admit: 2016-09-30 | Discharge: 2016-09-30 | Disposition: A | Discharge status: Home / Self Care | Payer: Medicare HMO | Source: Ambulatory Visit | Attending: Physician Assistant | Admitting: Physician Assistant

## 2016-09-30 DIAGNOSIS — M25511 Pain in right shoulder: Secondary | ICD-10-CM

## 2016-10-04 DIAGNOSIS — I4891 Unspecified atrial fibrillation: Secondary | ICD-10-CM | POA: Diagnosis not present

## 2016-10-04 DIAGNOSIS — E559 Vitamin D deficiency, unspecified: Secondary | ICD-10-CM | POA: Diagnosis not present

## 2016-10-24 ENCOUNTER — Ambulatory Visit (INDEPENDENT_AMBULATORY_CARE_PROVIDER_SITE_OTHER): Payer: Medicare HMO | Admitting: Gastroenterology

## 2016-10-24 ENCOUNTER — Encounter: Payer: Self-pay | Admitting: Gastroenterology

## 2016-10-24 ENCOUNTER — Telehealth: Payer: Self-pay | Admitting: *Deleted

## 2016-10-24 VITALS — BP 122/70 | HR 76 | Ht 64.75 in | Wt 192.0 lb

## 2016-10-24 DIAGNOSIS — K5900 Constipation, unspecified: Secondary | ICD-10-CM

## 2016-10-24 DIAGNOSIS — K219 Gastro-esophageal reflux disease without esophagitis: Secondary | ICD-10-CM | POA: Diagnosis not present

## 2016-10-24 DIAGNOSIS — J0100 Acute maxillary sinusitis, unspecified: Secondary | ICD-10-CM | POA: Diagnosis not present

## 2016-10-24 DIAGNOSIS — R1310 Dysphagia, unspecified: Secondary | ICD-10-CM

## 2016-10-24 MED ORDER — FAMOTIDINE 20 MG PO TABS: 20.0000 mg | ORAL_TABLET | Freq: Every day | ORAL | 3 refills | Status: DC

## 2016-10-24 MED ORDER — OMEPRAZOLE 40 MG PO CPDR: 40.0000 mg | DELAYED_RELEASE_CAPSULE | Freq: Every day | ORAL | 3 refills | Status: DC

## 2016-10-24 MED ORDER — LINACLOTIDE 72 MCG PO CAPS: 72.0000 ug | ORAL_CAPSULE | Freq: Every day | ORAL | 3 refills | Status: DC

## 2016-10-24 NOTE — Progress Notes (Signed)
Dorothy Mcdonald    [id number]    [birth year]  Primary Care Physician:ELKINS,WILSON Danne Baxter, MD  Referring Physician: Leonard Downing, MD [address] [place], [place] [address]  Chief complaint:  Emesis, constipation  HPI: 71 year old female previously followed by Dr. Olevia Perches, last seen in office in September 2015 is here to establish care. She presented to urgent care 08/31/2016 with complaints of epigastric pain, nausea and intermittent vomiting, also had an episode of coffee-ground emesis with specks of bright red blood. She has history of chronic A. fib and is on anticoagulation with Coumadin. Prior to the episodes of nausea and vomiting she was having severe constipation with no bowel movement for a week. She has been having worsening constipation with bowel movements about once a twice a week at the most and difficulty evacuating bowels /incomplete evacuation At urgent care she was treated with IV fluids Zofran and Pepcid, discharged homeon PPI and Carafate, labs (CBC, CMP, UA and lipase) and abdominal x-ray were unremarkable She hasnt had any episodes of vomiting since visit to urgent care. Continues to have constipation with incomplete evacuation, bloating and intermittent nausea.ROS positive for intermittent dysphagia/strangling with both solids and liquids Colonoscopy 03/2011 with removal of 45mm cecal polyp (tubular adenoma), family h/o colon cancer and personal history of tubular adenoma.     Outpatient Encounter Prescriptions as of 10/24/2016  Medication Sig  . atenolol (TENORMIN) 50 MG tablet Take 25 mg by mouth 2 (two) times daily.  Marland Kitchen topiramate (TOPAMAX) 100 MG tablet Take 100 mg by mouth daily.  . Vitamin D, Ergocalciferol, (DRISDOL) 50000 units CAPS capsule Take 50,000 Units by mouth every 7 (seven) days. Saturday  . warfarin (COUMADIN) 6 MG tablet Take 6 mg by mouth daily.  . [DISCONTINUED] aspirin 500 MG tablet Take 500 mg by mouth 2 (two) times daily.  FOR TMJ  . [DISCONTINUED] aspirin 81 MG tablet Take 81 mg by mouth daily.  . [DISCONTINUED] Cinnamon 500 MG TABS Take 2 tablets by mouth daily.  . [DISCONTINUED] famotidine (PEPCID) 20 MG tablet Take 1 tablet (20 mg total) by mouth 2 (two) times daily. (Patient not taking: Reported on 10/24/2016)  . [DISCONTINUED] Magnesium 100 MG CAPS Take 100 mg by mouth daily.  . [DISCONTINUED] ondansetron (ZOFRAN) 4 MG tablet Take 1 tablet (4 mg total) by mouth every 6 (six) hours. (Patient not taking: Reported on 10/24/2016)  . [DISCONTINUED] phentermine 37.5 MG capsule Take 37.5 mg by mouth daily. Takes 1/2-1 daily  . [DISCONTINUED] propranolol (INDERAL) 80 MG tablet Take 40-80 mg by mouth every 8 (eight) hours. Pt takes 1 tablet at night  . [DISCONTINUED] ranitidine (ZANTAC) 300 MG tablet Take 1 tablet (300 mg total) by mouth at bedtime. (Patient not taking: Reported on 10/24/2016)  . [DISCONTINUED] sucralfate (CARAFATE) 1 g tablet Take 1 tablet (1 g total) by mouth 4 (four) times daily -  with meals and at bedtime. (Patient not taking: Reported on 10/24/2016)   Facility-Administered Encounter Medications as of 10/24/2016  Medication  . 0.9 %  sodium chloride infusion    Allergies as of 10/24/2016 - Review Complete 10/24/2016  Allergen Reaction Noted  . Penicillins Nausea And Vomiting 08/31/2016    Past Medical History:  Diagnosis Date  . Adenomatous colon polyp 2003  . Arthritis   . Atrial fibrillation ([place])   . Gallstones   . Hepatic steatosis   . Hyperlipidemia   . IBS (irritable bowel syndrome)   . Irregular  heart beat   . Peptic ulcer disease   . Rotator cuff disorder, left   . TMJ (temporomandibular joint disorder)   . Vitamin D deficiency     Past Surgical History:  Procedure Laterality Date  . BREAST LUMPECTOMY Right   . CHOLECYSTECTOMY    . COLONOSCOPY    . PARTIAL HYSTERECTOMY  1983  . POLYPECTOMY    . RECTOCELE AND CYSTOCELE REPAIR    . TONSILLECTOMY      Family History    Problem Relation Age of Onset  . Colon cancer Father     DIED AT 43  . Colon cancer Paternal Aunt   . Colon cancer Paternal Uncle     x 3  . Ovarian cancer Mother   . Uterine cancer Mother   . Breast cancer Sister     Social History   Social History  . Marital status: Married    Spouse name: N/A  . Number of children: 4  . Years of education: N/A   Occupational History  . retired    Social History Main Topics  . Smoking status: Former Smoker    Types: Cigarettes    Quit date: 09/02/1984  . Smokeless tobacco: Never Used  . Alcohol use Yes     Comment: MONTHLY  . Drug use: No  . Sexual activity: Not on file   Other Topics Concern  . Not on file   Social History Narrative  . No narrative on file      Review of systems: Review of Systems  Constitutional: Negative for fever and chills.  HENT: Negative.   Eyes: Negative for blurred vision.  Respiratory: Negative for cough, shortness of breath and wheezing.   Cardiovascular: Negative for chest pain and palpitations.  Gastrointestinal: as per HPI Genitourinary: Negative for dysuria, urgency, frequency and hematuria.  Musculoskeletal: Positive for myalgias, back pain and joint pain.  Skin: Negative for itching and rash.  Neurological: Negative for dizziness, tremors, focal weakness, seizures and loss of consciousness.  Endo/Heme/Allergies: Positive for seasonal allergies.  Psychiatric/Behavioral: Negative for depression, suicidal ideas and hallucinations.  All other systems reviewed and are negative.   Physical Exam: Vitals:   10/24/16 0957  BP: 122/70  Pulse: 76   Body mass index is 32.2 kg/m. Gen:      No acute distress HEENT:  EOMI, sclera anicteric Neck:     No masses; no thyromegaly Lungs:    Clear to auscultation bilaterally; normal respiratory effort CV:         Regular rate and rhythm; no murmurs Abd:      + bowel sounds; soft, non-tender; no palpable masses, no distension Ext:    No edema;  adequate peripheral perfusion Skin:      Warm and dry; no rash Neuro: alert and oriented x 3 Psych: normal mood and affect  Data Reviewed:  Reviewed labs, radiology imaging, old records and pertinent past GI work up   Assessment and Plan/Recommendations:  54 yr F with afib on coumadin here for follow up  after recent visit to urgent care with constipation, nausea, vomiting, epigastric abdominal pain, intermittent dysphagia and an episode of coffee ground emesis  No further episodes since then  She is due for recall colonoscopy with history of tubular adenoma removed in 2012  We will schedule for EGD for evaluation of coffee-ground emesis, dysphagia,, nausea and epigastric abdominal pain along with surveillance colonoscopy The risks and benefits as well as alternatives of endoscopic procedure(s) have been discussed and  reviewed. All questions answered. The patient agrees to proceed.  Continue PPI and antireflux measures  Constipation: Start Linzess 72 g daily We will schedule for anorectal manometry to evaluate for possible outlet dysfunction based on history    We'll obtain clearance from prescribing M.D.  if okay to hold Coumadin for 5 days prior to the procedure 25 minutes was spent face-to-face with the patient. Greater than 50% of the time used for counseling as well as treatment plan and follow-up. She had multiple questions which were answered to her satisfaction  K. Denzil Magnuson , MD [phone] Mon-Fri 8a-5p [phone] after 5p, weekends, holidays  CC: Leonard Downing, *

## 2016-10-24 NOTE — Telephone Encounter (Signed)
  10/24/2016   RE: Dorothy Mcdonald DOB: [birth year] MRN: [id number]   Dear Dorothy Mcdonald   We have scheduled the above patient for an endoscopic procedure. Our records show that she is on anticoagulation therapy.   Please advise as to how long the patient may come off her therapy of Coumadin prior to the procedure, which is scheduled for 11/12/2016.  Please fax back/ or route the completed form to [facility] at [phone].   Sincerely,    Tonita Phoenix AAMA

## 2016-10-24 NOTE — Patient Instructions (Signed)
We will call you back when we schedule the Anorectal mano  We have sent in your prescriptions to your pharmacy  Follow up as needed  You will be contaced by our office prior to your procedure for directions on holding your Coumadin/Warfarin.  If you do not hear from our office 1 week prior to your scheduled procedure, please call [phone] to discuss.  You have been scheduled for a colonoscopy. Please follow written instructions given to you at your visit today.  Please pick up your prep supplies at the pharmacy within the next 1-3 days. If you use inhalers (even only as needed), please bring them with you on the day of your procedure. Your physician has requested that you go to www.startemmi.com and enter the access code given to you at your visit today. This web site gives a general overview about your procedure. However, you should still follow specific instructions given to you by our office regarding your preparation for the procedure.

## 2016-10-29 ENCOUNTER — Encounter: Payer: Self-pay | Admitting: Gastroenterology

## 2016-11-01 ENCOUNTER — Other Ambulatory Visit: Payer: Self-pay | Admitting: *Deleted

## 2016-11-01 DIAGNOSIS — K5902 Outlet dysfunction constipation: Secondary | ICD-10-CM

## 2016-11-01 NOTE — Telephone Encounter (Signed)
Received fax from Dr Edd Arbour to hold coumadin 5 days before procedure L/M for pt to contact the office  She also needs anorectal mano scheduled   Scheduled patients Anorectal mano on 11/22/2016 at 8:30am She will need these instructions gone over with her when she returns call     You have been scheduled to have an anorectal manometry at [facility] on 11/22/2016 at 8:30am. Please arrive 30 minutes prior to your appointment time for registration (1st floor of the hospital-admissions).  Please make certain to use 1 Fleets enema 2 hours prior to coming for your appointment. You can purchase Fleets enemas from the laxative section at your drug store. You should not eat anything during the two hours prior to the procedure. You may take regular medications with small sips of water at least 2 hours prior to the study.  Anorectal manometry is a test performed to evaluate patients with constipation or fecal incontinence. This test measures the pressures of the anal sphincter muscles, the sensation in the rectum, and the neural reflexes that are needed for normal bowel movements.  THE PROCEDURE The test takes approximately 30 minutes to 1 hour. You will be asked to change into a hospital gown. A technician or nurse will explain the procedure to you, take a brief health history, and answer any questions you may have. The patient then lies on his or her left side. A small, flexible tube, about the size of a thermometer, with a balloon at the end is inserted into the rectum. The catheter is connected to a machine that measures the pressure. During the test, the small balloon attached to the catheter may be inflated in the rectum to assess the normal reflex pathways. The nurse or technician may also ask the person to squeeze, relax, and push at various times. The anal sphincter muscle pressures are measured during each of these maneuvers. To squeeze, the patient tightens the sphincter muscles as if trying  to prevent anything from coming out. To push or bear down, the patient strains down as if trying to have a bowel movement.

## 2016-11-12 ENCOUNTER — Ambulatory Visit (AMBULATORY_SURGERY_CENTER): Payer: Medicare HMO | Admitting: Gastroenterology

## 2016-11-12 ENCOUNTER — Encounter: Payer: Self-pay | Admitting: Gastroenterology

## 2016-11-12 VITALS — BP 120/72 | HR 69 | Temp 96.8°F | Resp 10 | Ht 64.0 in | Wt 192.0 lb

## 2016-11-12 DIAGNOSIS — G4733 Obstructive sleep apnea (adult) (pediatric): Secondary | ICD-10-CM | POA: Diagnosis not present

## 2016-11-12 DIAGNOSIS — K219 Gastro-esophageal reflux disease without esophagitis: Secondary | ICD-10-CM

## 2016-11-12 DIAGNOSIS — K5900 Constipation, unspecified: Secondary | ICD-10-CM | POA: Diagnosis not present

## 2016-11-12 DIAGNOSIS — D122 Benign neoplasm of ascending colon: Secondary | ICD-10-CM | POA: Diagnosis not present

## 2016-11-12 DIAGNOSIS — D124 Benign neoplasm of descending colon: Secondary | ICD-10-CM | POA: Diagnosis not present

## 2016-11-12 DIAGNOSIS — I4891 Unspecified atrial fibrillation: Secondary | ICD-10-CM | POA: Diagnosis not present

## 2016-11-12 DIAGNOSIS — K30 Functional dyspepsia: Secondary | ICD-10-CM | POA: Diagnosis not present

## 2016-11-12 DIAGNOSIS — R1310 Dysphagia, unspecified: Secondary | ICD-10-CM | POA: Diagnosis not present

## 2016-11-12 DIAGNOSIS — Z86010 Personal history of colonic polyps: Secondary | ICD-10-CM

## 2016-11-12 DIAGNOSIS — I10 Essential (primary) hypertension: Secondary | ICD-10-CM | POA: Diagnosis not present

## 2016-11-12 MED ORDER — SODIUM CHLORIDE 0.9 % IV SOLN: 500.0000 mL | INTRAVENOUS | Status: DC

## 2016-11-12 NOTE — Progress Notes (Signed)
Report to PACU, RN, vss, BBS= Clear.  

## 2016-11-12 NOTE — Op Note (Signed)
[place] Patient Name: Dorothy Mcdonald Procedure Date: 11/12/2016 2:11 PM MRN: [id number] Endoscopist: Mauri Pole , MD Age: 71 Referring MD:  Date of Birth: 1946/08/03 Gender: Female Account #: [id number] Procedure:                Upper GI endoscopy Indications:              Esophageal reflux symptoms that recur despite                            appropriate therapy Medicines:                Monitored Anesthesia Care Procedure:                Pre-Anesthesia Assessment:                           - Prior to the procedure, a History and Physical                            was performed, and patient medications and                            allergies were reviewed. The patient's tolerance of                            previous anesthesia was also reviewed. The risks                            and benefits of the procedure and the sedation                            options and risks were discussed with the patient.                            All questions were answered, and informed consent                            was obtained. Prior Anticoagulants: The patient                            last took Coumadin (warfarin) 5 days prior to the                            procedure. ASA Grade Assessment: II - A patient                            with mild systemic disease. After reviewing the                            risks and benefits, the patient was deemed in                            satisfactory condition to undergo the procedure.  After obtaining informed consent, the endoscope was                            passed under direct vision. Throughout the                            procedure, the patient's blood pressure, pulse, and                            oxygen saturations were monitored continuously. The                            Model GIF-HQ190 [id number]) scope was introduced                            through the mouth, and advanced  to the second part                            of duodenum. The upper GI endoscopy was                            accomplished without difficulty. The patient                            tolerated the procedure well. Scope In: Scope Out: Findings:                 A small hiatal hernia was present ~3cm in size.                            Regular Z-line at 34cm                           The exam was otherwise without abnormality.                           The cardia and gastric fundus were normal on                            retroflexion.                           The examined duodenum was normal. Complications:            No immediate complications. Estimated Blood Loss:     Estimated blood loss: none. Impression:               - Small hiatal hernia.                           - The examination was otherwise normal.                           - Normal examined duodenum.                           - No specimens collected. Recommendation:           -  Patient has a contact number available for                            emergencies. The signs and symptoms of potential                            delayed complications were discussed with the                            patient. Return to normal activities tomorrow.                            Written discharge instructions were provided to the                            patient.                           - Resume previous diet.                           - Continue present medications.                           - No aspirin, ibuprofen, naproxen, or other                            non-steroidal anti-inflammatory drugs. Mauri Pole, MD 11/12/2016 2:46:22 PM This report has been signed electronically.

## 2016-11-12 NOTE — Op Note (Signed)
[place] Patient Name: Dorothy Mcdonald Procedure Date: 11/12/2016 2:10 PM MRN: [id number] Endoscopist: Mauri Pole , MD Age: 71 Referring MD:  Date of Birth: November 24, 1945 Gender: Female Account #: [id number] Procedure:                Colonoscopy Indications:              Surveillance: Personal history of adenomatous                            polyps on last colonoscopy > 5 years ago, High risk                            colon cancer surveillance: Personal history of                            adenoma less than 10 mm in size Medicines:                Monitored Anesthesia Care Procedure:                Pre-Anesthesia Assessment:                           - Prior to the procedure, a History and Physical                            was performed, and patient medications and                            allergies were reviewed. The patient's tolerance of                            previous anesthesia was also reviewed. The risks                            and benefits of the procedure and the sedation                            options and risks were discussed with the patient.                            All questions were answered, and informed consent                            was obtained. Prior Anticoagulants: The patient has                            taken no previous anticoagulant or antiplatelet                            agents. ASA Grade Assessment: II - A patient with                            mild systemic disease. After reviewing the risks  and benefits, the patient was deemed in                            satisfactory condition to undergo the procedure.                           After obtaining informed consent, the colonoscope                            was passed under direct vision. Throughout the                            procedure, the patient's blood pressure, pulse, and                            oxygen saturations were  monitored continuously. The                            Model CF-HQ190L [id number]) scope was introduced                            through the anus and advanced to the the terminal                            ileum, with identification of the appendiceal                            orifice and IC valve. The colonoscopy was performed                            without difficulty. The patient tolerated the                            procedure well. The quality of the bowel                            preparation was excellent. The terminal ileum,                            ileocecal valve, appendiceal orifice, and rectum                            were photographed. Scope In: 2:25:09 PM Scope Out: 2:41:42 PM Scope Withdrawal Time: 0 hours 12 minutes 50 seconds  Total Procedure Duration: 0 hours 16 minutes 33 seconds  Findings:                 The perianal and digital rectal examinations were                            normal.                           Two sessile polyps were found in the descending  colon and ascending colon. The polyps were 7 to 11                            mm in size. These polyps were removed with a cold                            snare. Resection and retrieval were complete. Noted                            brisk ooze at site of larger polypectomy,                            controlled with cautery with tip snare, good                            hemostasis.                           Non-bleeding internal hemorrhoids were found during                            retroflexion. The hemorrhoids were medium-sized.                           The exam was otherwise without abnormality. Complications:            No immediate complications. Estimated Blood Loss:     Estimated blood loss was minimal. Impression:               - Two 7 to 11 mm polyps in the descending colon and                            in the ascending colon, removed with a cold snare.                             Resected and retrieved.                           - Non-bleeding internal hemorrhoids.                           - The examination was otherwise normal. Recommendation:           - Patient has a contact number available for                            emergencies. The signs and symptoms of potential                            delayed complications were discussed with the                            patient. Return to normal activities tomorrow.  Written discharge instructions were provided to the                            patient.                           - Resume previous diet.                           - Continue present medications.                           - Await pathology results.                           - Repeat colonoscopy in 3 - 5 years for                            surveillance based on pathology results.                           - Return to GI clinic PRN.                           - Resume Coumadin (warfarin) at prior dose                            tomorrow. Refer to Coumadin Clinic for further                            adjustment of therapy.                           - No aspirin, ibuprofen, naproxen, or other                            non-steroidal anti-inflammatory drugs for 2 weeks. Mauri Pole, MD 11/12/2016 2:50:41 PM This report has been signed electronically.

## 2016-11-12 NOTE — Progress Notes (Signed)
Called to room to assist during endoscopic procedure.  Patient ID and intended procedure confirmed with present staff. Received instructions for my participation in the procedure from the performing physician.  

## 2016-11-12 NOTE — Patient Instructions (Signed)
YOU HAD AN ENDOSCOPIC PROCEDURE TODAY AT [facility]:   Refer to the procedure report that was given to you for any specific questions about what was found during the examination.  If the procedure report does not answer your questions, please call your gastroenterologist to clarify.  If you requested that your care partner not be given the details of your procedure findings, then the procedure report has been included in a sealed envelope for you to review at your convenience later.  YOU SHOULD EXPECT: Some feelings of bloating in the abdomen. Passage of more gas than usual.  Walking can help get rid of the air that was put into your GI tract during the procedure and reduce the bloating. If you had a lower endoscopy (such as a colonoscopy or flexible sigmoidoscopy) you may notice spotting of blood in your stool or on the toilet paper. If you underwent a bowel prep for your procedure, you may not have a normal bowel movement for a few days.  Please Note:  You might notice some irritation and congestion in your nose or some drainage.  This is from the oxygen used during your procedure.  There is no need for concern and it should clear up in a day or so.  SYMPTOMS TO REPORT IMMEDIATELY:   Following lower endoscopy (colonoscopy or flexible sigmoidoscopy):  Excessive amounts of blood in the stool  Significant tenderness or worsening of abdominal pains  Swelling of the abdomen that is new, acute  Fever of 100F or higher   Following upper endoscopy (EGD)  Vomiting of blood or coffee ground material  New chest pain or pain under the shoulder blades  Painful or persistently difficult swallowing  New shortness of breath  Fever of 100F or higher  Black, tarry-looking stools  For urgent or emergent issues, a gastroenterologist can be reached at any hour by calling [phone].   DIET:  We do recommend a small meal at first, but then you may proceed to your regular diet.  Drink  plenty of fluids but you should avoid alcoholic beverages for 24 hours.  ACTIVITY:  You should plan to take it easy for the rest of today and you should NOT DRIVE or use heavy machinery until tomorrow (because of the sedation medicines used during the test).    FOLLOW UP: Our staff will call the number listed on your records the next business day following your procedure to check on you and address any questions or concerns that you may have regarding the information given to you following your procedure. If we do not reach you, we will leave a message.  However, if you are feeling well and you are not experiencing any problems, there is no need to return our call.  We will assume that you have returned to your regular daily activities without incident.  If any biopsies were taken you will be contacted by phone or by letter within the next 1-3 weeks.  Please call us at [phone] if you have not heard about the biopsies in 3 weeks.    SIGNATURES/CONFIDENTIALITY: You and/or your care partner have signed paperwork which will be entered into your electronic medical record.  These signatures attest to the fact that that the information above on your After Visit Summary has been reviewed and is understood.  Full responsibility of the confidentiality of this discharge information lies with you and/or your care-partner.  Hiatal hernia information given.  No aspirin, ibruprofen, naproxen or other  non sterodal anti-infammatory medications.  Polyp and hemorrhoid information given.  Resume coumadin tomorrow.

## 2016-11-13 ENCOUNTER — Telehealth: Payer: Self-pay | Admitting: *Deleted

## 2016-11-13 NOTE — Telephone Encounter (Signed)
  Follow up Call-  Call back number 11/12/2016  Post procedure Call Back phone  # [phone]  Permission to leave phone message Yes  Some recent data might be hidden     Patient questions:  Do you have a fever, pain , or abdominal swelling? No. Pain Score  0 *  Have you tolerated food without any problems? Yes.    Have you been able to return to your normal activities? Yes.    Do you have any questions about your discharge instructions: Diet   No. Medications  No. Follow up visit  No.  Do you have questions or concerns about your Care? No.  Actions: * If pain score is 4 or above: No action needed, pain <4.

## 2016-11-15 ENCOUNTER — Encounter: Payer: Self-pay | Admitting: Gastroenterology

## 2016-11-18 DIAGNOSIS — N3001 Acute cystitis with hematuria: Secondary | ICD-10-CM | POA: Diagnosis not present

## 2016-11-18 DIAGNOSIS — N3090 Cystitis, unspecified without hematuria: Secondary | ICD-10-CM | POA: Diagnosis not present

## 2016-11-21 ENCOUNTER — Telehealth: Payer: Self-pay | Admitting: Gastroenterology

## 2016-11-21 NOTE — Telephone Encounter (Signed)
Reviewed the instructions for anal manometry as are in the letter. She was invited to ask questions which were answered to her satisfaction. Patient thanked me for the returned call.

## 2016-11-22 ENCOUNTER — Encounter (HOSPITAL_COMMUNITY)
Admission: RE | Disposition: A | Discharge status: Home / Self Care | Payer: Self-pay | Source: Ambulatory Visit | Attending: Gastroenterology

## 2016-11-22 ENCOUNTER — Ambulatory Visit (HOSPITAL_COMMUNITY)
Admission: RE | Admit: 2016-11-22 | Discharge: 2016-11-22 | Disposition: A | Discharge status: Home / Self Care | Payer: Medicare HMO | Source: Ambulatory Visit | Attending: Gastroenterology | Admitting: Gastroenterology

## 2016-11-22 DIAGNOSIS — K5900 Constipation, unspecified: Secondary | ICD-10-CM | POA: Diagnosis present

## 2016-11-22 DIAGNOSIS — K5902 Outlet dysfunction constipation: Secondary | ICD-10-CM | POA: Diagnosis not present

## 2016-11-22 HISTORY — PX: ANAL RECTAL MANOMETRY: SHX6358

## 2016-11-22 SURGERY — MANOMETRY, ANORECTAL

## 2016-11-22 NOTE — Progress Notes (Signed)
Anal rectal manometry done per protocol.  Pt. Tolerated well without complication.  Dr. Silverio Decamp to be notified today.   Laverta Baltimore, RN

## 2016-11-24 ENCOUNTER — Encounter (HOSPITAL_COMMUNITY): Payer: Self-pay | Admitting: Gastroenterology

## 2016-11-25 DIAGNOSIS — R002 Palpitations: Secondary | ICD-10-CM | POA: Diagnosis not present

## 2016-11-28 DIAGNOSIS — K5902 Outlet dysfunction constipation: Secondary | ICD-10-CM

## 2016-12-18 DIAGNOSIS — R002 Palpitations: Secondary | ICD-10-CM | POA: Diagnosis not present

## 2016-12-18 DIAGNOSIS — R0602 Shortness of breath: Secondary | ICD-10-CM | POA: Diagnosis not present

## 2016-12-20 ENCOUNTER — Other Ambulatory Visit: Payer: Self-pay | Admitting: *Deleted

## 2016-12-20 MED ORDER — OMEPRAZOLE 40 MG PO CPDR: 40.0000 mg | DELAYED_RELEASE_CAPSULE | Freq: Every day | ORAL | 3 refills | Status: DC

## 2016-12-20 MED ORDER — FAMOTIDINE 20 MG PO TABS: 20.0000 mg | ORAL_TABLET | Freq: Every day | ORAL | 3 refills | Status: DC

## 2016-12-24 ENCOUNTER — Other Ambulatory Visit: Payer: Self-pay

## 2016-12-24 DIAGNOSIS — K5902 Outlet dysfunction constipation: Secondary | ICD-10-CM

## 2017-01-04 DIAGNOSIS — M7542 Impingement syndrome of left shoulder: Secondary | ICD-10-CM | POA: Diagnosis not present

## 2017-01-04 DIAGNOSIS — M25512 Pain in left shoulder: Secondary | ICD-10-CM | POA: Diagnosis not present

## 2017-01-04 DIAGNOSIS — M25511 Pain in right shoulder: Secondary | ICD-10-CM | POA: Diagnosis not present

## 2017-01-06 DIAGNOSIS — I4891 Unspecified atrial fibrillation: Secondary | ICD-10-CM | POA: Diagnosis not present

## 2017-01-22 DIAGNOSIS — R300 Dysuria: Secondary | ICD-10-CM | POA: Diagnosis not present

## 2017-02-03 DIAGNOSIS — M7541 Impingement syndrome of right shoulder: Secondary | ICD-10-CM | POA: Diagnosis not present

## 2017-02-03 DIAGNOSIS — M7542 Impingement syndrome of left shoulder: Secondary | ICD-10-CM | POA: Diagnosis not present

## 2017-02-10 DIAGNOSIS — M7542 Impingement syndrome of left shoulder: Secondary | ICD-10-CM | POA: Diagnosis not present

## 2017-02-17 DIAGNOSIS — M25512 Pain in left shoulder: Secondary | ICD-10-CM | POA: Diagnosis not present

## 2017-02-17 DIAGNOSIS — M7542 Impingement syndrome of left shoulder: Secondary | ICD-10-CM | POA: Diagnosis not present

## 2017-02-17 DIAGNOSIS — M25511 Pain in right shoulder: Secondary | ICD-10-CM | POA: Diagnosis not present

## 2017-03-12 DIAGNOSIS — I480 Paroxysmal atrial fibrillation: Secondary | ICD-10-CM | POA: Diagnosis not present

## 2017-03-24 DIAGNOSIS — I480 Paroxysmal atrial fibrillation: Secondary | ICD-10-CM | POA: Diagnosis present

## 2017-03-27 DIAGNOSIS — Z4509 Encounter for adjustment and management of other cardiac device: Secondary | ICD-10-CM | POA: Diagnosis not present

## 2017-03-27 DIAGNOSIS — R002 Palpitations: Secondary | ICD-10-CM | POA: Diagnosis not present

## 2017-04-18 DIAGNOSIS — I480 Paroxysmal atrial fibrillation: Secondary | ICD-10-CM | POA: Diagnosis not present

## 2017-04-18 DIAGNOSIS — Z45010 Encounter for checking and testing of cardiac pacemaker pulse generator [battery]: Secondary | ICD-10-CM | POA: Diagnosis not present

## 2017-04-18 DIAGNOSIS — Z45018 Encounter for adjustment and management of other part of cardiac pacemaker: Secondary | ICD-10-CM | POA: Diagnosis not present

## 2017-04-18 DIAGNOSIS — Z4509 Encounter for adjustment and management of other cardiac device: Secondary | ICD-10-CM | POA: Diagnosis not present

## 2017-04-18 DIAGNOSIS — R002 Palpitations: Secondary | ICD-10-CM | POA: Diagnosis not present

## 2017-05-13 DIAGNOSIS — I4891 Unspecified atrial fibrillation: Secondary | ICD-10-CM | POA: Diagnosis not present

## 2017-05-13 DIAGNOSIS — Z23 Encounter for immunization: Secondary | ICD-10-CM | POA: Diagnosis not present

## 2017-05-13 DIAGNOSIS — R634 Abnormal weight loss: Secondary | ICD-10-CM | POA: Diagnosis not present

## 2017-06-16 DIAGNOSIS — Z95811 Presence of heart assist device: Secondary | ICD-10-CM | POA: Diagnosis not present

## 2017-06-16 DIAGNOSIS — I480 Paroxysmal atrial fibrillation: Secondary | ICD-10-CM | POA: Diagnosis not present

## 2017-06-16 DIAGNOSIS — Z4509 Encounter for adjustment and management of other cardiac device: Secondary | ICD-10-CM | POA: Diagnosis not present

## 2017-06-25 DIAGNOSIS — H5203 Hypermetropia, bilateral: Secondary | ICD-10-CM | POA: Diagnosis not present

## 2017-07-21 DIAGNOSIS — Z4509 Encounter for adjustment and management of other cardiac device: Secondary | ICD-10-CM | POA: Diagnosis not present

## 2017-07-21 DIAGNOSIS — R002 Palpitations: Secondary | ICD-10-CM | POA: Diagnosis not present

## 2017-09-05 ENCOUNTER — Other Ambulatory Visit: Payer: Self-pay | Admitting: Family Medicine

## 2017-09-05 DIAGNOSIS — Z1231 Encounter for screening mammogram for malignant neoplasm of breast: Secondary | ICD-10-CM

## 2017-09-10 DIAGNOSIS — Z01419 Encounter for gynecological examination (general) (routine) without abnormal findings: Secondary | ICD-10-CM | POA: Diagnosis not present

## 2017-09-10 DIAGNOSIS — Z6826 Body mass index (BMI) 26.0-26.9, adult: Secondary | ICD-10-CM | POA: Diagnosis not present

## 2017-09-12 DIAGNOSIS — H18413 Arcus senilis, bilateral: Secondary | ICD-10-CM | POA: Diagnosis not present

## 2017-09-12 DIAGNOSIS — H2511 Age-related nuclear cataract, right eye: Secondary | ICD-10-CM | POA: Diagnosis not present

## 2017-09-12 DIAGNOSIS — H2513 Age-related nuclear cataract, bilateral: Secondary | ICD-10-CM | POA: Diagnosis not present

## 2017-09-12 DIAGNOSIS — H25043 Posterior subcapsular polar age-related cataract, bilateral: Secondary | ICD-10-CM | POA: Diagnosis not present

## 2017-09-12 DIAGNOSIS — H25013 Cortical age-related cataract, bilateral: Secondary | ICD-10-CM | POA: Diagnosis not present

## 2017-09-15 DIAGNOSIS — R002 Palpitations: Secondary | ICD-10-CM | POA: Diagnosis not present

## 2017-09-15 DIAGNOSIS — Z4509 Encounter for adjustment and management of other cardiac device: Secondary | ICD-10-CM | POA: Diagnosis not present

## 2017-09-15 DIAGNOSIS — I480 Paroxysmal atrial fibrillation: Secondary | ICD-10-CM | POA: Diagnosis not present

## 2017-09-26 ENCOUNTER — Other Ambulatory Visit: Payer: Self-pay | Admitting: Family Medicine

## 2017-09-26 ENCOUNTER — Ambulatory Visit
Admission: RE | Admit: 2017-09-26 | Discharge: 2017-09-26 | Disposition: A | Discharge status: Home / Self Care | Payer: Medicare HMO | Source: Ambulatory Visit | Attending: Family Medicine | Admitting: Family Medicine

## 2017-09-26 DIAGNOSIS — Z1231 Encounter for screening mammogram for malignant neoplasm of breast: Secondary | ICD-10-CM

## 2017-09-26 DIAGNOSIS — N6310 Unspecified lump in the right breast, unspecified quadrant: Secondary | ICD-10-CM

## 2017-10-02 ENCOUNTER — Ambulatory Visit
Admission: RE | Admit: 2017-10-02 | Discharge: 2017-10-02 | Disposition: A | Discharge status: Home / Self Care | Payer: Medicare HMO | Source: Ambulatory Visit | Attending: Family Medicine | Admitting: Family Medicine

## 2017-10-02 DIAGNOSIS — N6310 Unspecified lump in the right breast, unspecified quadrant: Secondary | ICD-10-CM

## 2017-10-02 DIAGNOSIS — N6311 Unspecified lump in the right breast, upper outer quadrant: Secondary | ICD-10-CM | POA: Diagnosis not present

## 2017-10-02 DIAGNOSIS — N6312 Unspecified lump in the right breast, upper inner quadrant: Secondary | ICD-10-CM | POA: Diagnosis not present

## 2017-10-02 DIAGNOSIS — R921 Mammographic calcification found on diagnostic imaging of breast: Secondary | ICD-10-CM | POA: Diagnosis not present

## 2017-10-17 DIAGNOSIS — H2512 Age-related nuclear cataract, left eye: Secondary | ICD-10-CM | POA: Diagnosis not present

## 2017-10-17 DIAGNOSIS — H2511 Age-related nuclear cataract, right eye: Secondary | ICD-10-CM | POA: Diagnosis not present

## 2017-10-18 DIAGNOSIS — H2511 Age-related nuclear cataract, right eye: Secondary | ICD-10-CM | POA: Diagnosis not present

## 2017-10-31 DIAGNOSIS — H2512 Age-related nuclear cataract, left eye: Secondary | ICD-10-CM | POA: Diagnosis not present

## 2017-12-03 DIAGNOSIS — R300 Dysuria: Secondary | ICD-10-CM | POA: Diagnosis not present

## 2017-12-03 DIAGNOSIS — R350 Frequency of micturition: Secondary | ICD-10-CM | POA: Diagnosis not present

## 2017-12-15 DIAGNOSIS — Z4509 Encounter for adjustment and management of other cardiac device: Secondary | ICD-10-CM | POA: Diagnosis not present

## 2017-12-15 DIAGNOSIS — R002 Palpitations: Secondary | ICD-10-CM | POA: Diagnosis not present

## 2017-12-16 DIAGNOSIS — Z4509 Encounter for adjustment and management of other cardiac device: Secondary | ICD-10-CM | POA: Diagnosis not present

## 2017-12-16 DIAGNOSIS — R000 Tachycardia, unspecified: Secondary | ICD-10-CM | POA: Diagnosis not present

## 2017-12-18 DIAGNOSIS — Z7901 Long term (current) use of anticoagulants: Secondary | ICD-10-CM | POA: Diagnosis not present

## 2017-12-18 DIAGNOSIS — I10 Essential (primary) hypertension: Secondary | ICD-10-CM | POA: Diagnosis not present

## 2017-12-18 DIAGNOSIS — I480 Paroxysmal atrial fibrillation: Secondary | ICD-10-CM | POA: Diagnosis not present

## 2017-12-18 DIAGNOSIS — K219 Gastro-esophageal reflux disease without esophagitis: Secondary | ICD-10-CM | POA: Diagnosis not present

## 2017-12-18 DIAGNOSIS — R9431 Abnormal electrocardiogram [ECG] [EKG]: Secondary | ICD-10-CM | POA: Diagnosis not present

## 2017-12-18 DIAGNOSIS — Z95818 Presence of other cardiac implants and grafts: Secondary | ICD-10-CM | POA: Diagnosis not present

## 2017-12-30 DIAGNOSIS — E0590 Thyrotoxicosis, unspecified without thyrotoxic crisis or storm: Secondary | ICD-10-CM | POA: Diagnosis not present

## 2017-12-30 DIAGNOSIS — R300 Dysuria: Secondary | ICD-10-CM | POA: Diagnosis not present

## 2017-12-30 DIAGNOSIS — I4891 Unspecified atrial fibrillation: Secondary | ICD-10-CM | POA: Diagnosis not present

## 2017-12-30 DIAGNOSIS — M316 Other giant cell arteritis: Secondary | ICD-10-CM | POA: Diagnosis not present

## 2018-01-02 DIAGNOSIS — E0590 Thyrotoxicosis, unspecified without thyrotoxic crisis or storm: Secondary | ICD-10-CM | POA: Diagnosis not present

## 2018-01-05 DIAGNOSIS — E663 Overweight: Secondary | ICD-10-CM | POA: Diagnosis not present

## 2018-01-05 DIAGNOSIS — M2550 Pain in unspecified joint: Secondary | ICD-10-CM | POA: Diagnosis not present

## 2018-01-05 DIAGNOSIS — Z6827 Body mass index (BMI) 27.0-27.9, adult: Secondary | ICD-10-CM | POA: Diagnosis not present

## 2018-01-05 DIAGNOSIS — R220 Localized swelling, mass and lump, head: Secondary | ICD-10-CM | POA: Diagnosis not present

## 2018-01-15 ENCOUNTER — Other Ambulatory Visit: Payer: Self-pay | Admitting: Gastroenterology

## 2018-01-28 ENCOUNTER — Other Ambulatory Visit: Payer: Self-pay | Admitting: Gastroenterology

## 2018-03-16 DIAGNOSIS — Z4509 Encounter for adjustment and management of other cardiac device: Secondary | ICD-10-CM | POA: Diagnosis not present

## 2018-03-16 DIAGNOSIS — R002 Palpitations: Secondary | ICD-10-CM | POA: Diagnosis not present

## 2018-04-23 DIAGNOSIS — Z4509 Encounter for adjustment and management of other cardiac device: Secondary | ICD-10-CM | POA: Diagnosis not present

## 2018-04-23 DIAGNOSIS — R002 Palpitations: Secondary | ICD-10-CM | POA: Diagnosis not present

## 2018-05-18 DIAGNOSIS — R5383 Other fatigue: Secondary | ICD-10-CM | POA: Diagnosis not present

## 2018-05-18 DIAGNOSIS — E0590 Thyrotoxicosis, unspecified without thyrotoxic crisis or storm: Secondary | ICD-10-CM | POA: Diagnosis not present

## 2018-06-02 DIAGNOSIS — Z23 Encounter for immunization: Secondary | ICD-10-CM | POA: Diagnosis not present

## 2018-06-12 DIAGNOSIS — Z4509 Encounter for adjustment and management of other cardiac device: Secondary | ICD-10-CM | POA: Diagnosis not present

## 2018-06-12 DIAGNOSIS — R002 Palpitations: Secondary | ICD-10-CM | POA: Diagnosis not present

## 2018-12-24 DIAGNOSIS — K219 Gastro-esophageal reflux disease without esophagitis: Secondary | ICD-10-CM | POA: Diagnosis present

## 2019-01-26 ENCOUNTER — Other Ambulatory Visit: Payer: Self-pay | Admitting: Gastroenterology

## 2019-01-27 ENCOUNTER — Other Ambulatory Visit: Payer: Self-pay | Admitting: Gastroenterology

## 2019-01-29 ENCOUNTER — Other Ambulatory Visit: Payer: Self-pay | Admitting: Gastroenterology

## 2019-04-22 ENCOUNTER — Other Ambulatory Visit: Payer: Self-pay | Admitting: Gastroenterology

## 2019-05-12 ENCOUNTER — Other Ambulatory Visit: Payer: Self-pay | Admitting: Gastroenterology

## 2019-06-24 ENCOUNTER — Other Ambulatory Visit: Payer: Self-pay | Admitting: Family Medicine

## 2019-06-24 DIAGNOSIS — Z1231 Encounter for screening mammogram for malignant neoplasm of breast: Secondary | ICD-10-CM

## 2019-06-24 DIAGNOSIS — E2839 Other primary ovarian failure: Secondary | ICD-10-CM

## 2019-08-12 ENCOUNTER — Ambulatory Visit: Payer: Medicare HMO | Admitting: Gastroenterology

## 2019-08-20 ENCOUNTER — Other Ambulatory Visit: Payer: Self-pay | Admitting: Gastroenterology

## 2019-09-16 ENCOUNTER — Ambulatory Visit
Admission: RE | Admit: 2019-09-16 | Discharge: 2019-09-16 | Disposition: A | Discharge status: Home / Self Care | Payer: Medicare HMO | Source: Ambulatory Visit | Attending: Family Medicine | Admitting: Family Medicine

## 2019-09-16 ENCOUNTER — Other Ambulatory Visit: Payer: Self-pay

## 2019-09-16 DIAGNOSIS — E2839 Other primary ovarian failure: Secondary | ICD-10-CM

## 2019-09-16 DIAGNOSIS — Z1231 Encounter for screening mammogram for malignant neoplasm of breast: Secondary | ICD-10-CM

## 2019-09-20 ENCOUNTER — Encounter: Payer: Self-pay | Admitting: *Deleted

## 2019-09-22 ENCOUNTER — Encounter: Payer: Self-pay | Admitting: Gastroenterology

## 2019-09-22 ENCOUNTER — Ambulatory Visit: Payer: Medicare HMO | Admitting: Gastroenterology

## 2019-09-22 VITALS — BP 140/64 | HR 79 | Ht 64.0 in | Wt 184.0 lb

## 2019-09-22 DIAGNOSIS — K219 Gastro-esophageal reflux disease without esophagitis: Secondary | ICD-10-CM | POA: Diagnosis not present

## 2019-09-22 DIAGNOSIS — R1013 Epigastric pain: Secondary | ICD-10-CM | POA: Diagnosis not present

## 2019-09-22 DIAGNOSIS — K5904 Chronic idiopathic constipation: Secondary | ICD-10-CM

## 2019-09-22 DIAGNOSIS — Z7901 Long term (current) use of anticoagulants: Secondary | ICD-10-CM

## 2019-09-22 DIAGNOSIS — Z86010 Personal history of colonic polyps: Secondary | ICD-10-CM | POA: Diagnosis not present

## 2019-09-22 DIAGNOSIS — K5902 Outlet dysfunction constipation: Secondary | ICD-10-CM

## 2019-09-22 MED ORDER — OMEPRAZOLE 40 MG PO CPDR: 40.0000 mg | DELAYED_RELEASE_CAPSULE | Freq: Every day | ORAL | 3 refills | Status: DC

## 2019-09-22 MED ORDER — LINACLOTIDE 72 MCG PO CAPS: 72.0000 ug | ORAL_CAPSULE | Freq: Every day | ORAL | 3 refills | Status: DC

## 2019-09-22 NOTE — Progress Notes (Signed)
Dorothy Mcdonald    [id number]    Mar 29, 1946  Primary Care Physician:Elkins, [name], MD  Referring Physician: Leonard Downing, MD [place],  [place] [address]   Chief complaint:  GERD, constipation, adenomatous colon polyps  HPI: 74 year old female here for follow-up visit for GERD and constipation.  Complaints of shortness of breath and tightness in the chest in the past few months, she stopped taking omeprazole in the past year and she feels her reflux symptoms have recurred.  She does not usually feel the heartburn.  Denies any odynophagia or dysphagia.  She has intermittent epigastric abdominal pain.  Symptoms are somewhat better but not completely improved.  She has history of chronic constipation, does not evacuate.  Sometimes she goes for a week without a bowel movement.  She was doing well on Linzess but her insurance denied coverage.  She is using over-the-counter laxatives and has tried MiraLAX, fiber supplements with no improvement or consistent results.    Anorectal manometry March 2018 showed weak external anal sphincter, dyssynergy defecation.  Patient was referred to PT for biofeedback but she did not like doing it and did not complete the sessions. Colonoscopy November 12, 2016: 2 sessile polyps ranging in size from 7 to 11 mm removed from ascending and descending colon, internal hemorrhoids otherwise normal exam. EGD November 12, 2016 showed hiatal hernia otherwise normal exam  Outpatient Encounter Medications as of 09/22/2019  Medication Sig  . apixaban (ELIQUIS) 5 MG TABS tablet Take 5 mg by mouth 2 (two) times daily.  . famotidine (PEPCID) 40 MG tablet Take 1 tablet (40 mg total) by mouth at bedtime.  . fluticasone (FLONASE) 50 MCG/ACT nasal spray INSTILL 2 SPRAYS IN EACH NOSTRIL DAILY  . omeprazole (PRILOSEC) 40 MG capsule TAKE 1 CAPSULE BY MOUTH EVERY DAY  . venlafaxine XR (EFFEXOR-XR) 150 MG 24 hr capsule Take 150 mg by mouth daily.   . furosemide (LASIX) 20 MG tablet Take by mouth.  . [DISCONTINUED] atenolol (TENORMIN) 50 MG tablet Take 25 mg by mouth 2 (two) times daily.  . [DISCONTINUED] linaclotide (LINZESS) 72 MCG capsule Take 1 capsule (72 mcg total) by mouth daily before breakfast. (Patient not taking: Reported on 11/12/2016)  . [DISCONTINUED] topiramate (TOPAMAX) 100 MG tablet Take 100 mg by mouth daily.  . [DISCONTINUED] Vitamin D, Ergocalciferol, (DRISDOL) 50000 units CAPS capsule Take 50,000 Units by mouth every 7 (seven) days. Saturday  . [DISCONTINUED] warfarin (COUMADIN) 6 MG tablet Take 6 mg by mouth daily.   Facility-Administered Encounter Medications as of 09/22/2019  Medication  . 0.9 %  sodium chloride infusion  . 0.9 %  sodium chloride infusion    Allergies as of 09/22/2019 - Review Complete 09/26/2017  Allergen Reaction Noted  . Penicillins Nausea And Vomiting 08/31/2016    Past Medical History:  Diagnosis Date  . Adenomatous colon polyp 2003  . Arthritis   . Atrial fibrillation ([place])   . Gallstones   . Hepatic steatosis   . Hiatal hernia   . Hyperlipidemia   . IBS (irritable bowel syndrome)   . Internal hemorrhoids   . Irregular heart beat   . Peptic ulcer disease   . Rotator cuff disorder, left   . TMJ (temporomandibular joint disorder)   . Vitamin D deficiency     Past Surgical History:  Procedure Laterality Date  . ANAL RECTAL MANOMETRY N/A 11/22/2016   Procedure: ANO RECTAL MANOMETRY;  Surgeon: Harl Bowie  V, MD;  Location: [facility];  Service: Endoscopy;  Laterality: N/A;  . BREAST LUMPECTOMY Right   . CHOLECYSTECTOMY    . COLONOSCOPY    . HAND SURGERY     3 hand surgeries  . PARTIAL HYSTERECTOMY  1983  . POLYPECTOMY    . RECTOCELE AND CYSTOCELE REPAIR    . TONSILLECTOMY      Family History  Problem Relation Age of Onset  . Colon cancer Father        DIED AT 82  . Colon cancer Paternal Aunt   . Colon cancer Paternal Uncle        x 3  . Ovarian cancer  Mother   . Uterine cancer Mother   . Breast cancer Sister     Social History   Socioeconomic History  . Marital status: Married    Spouse name: Not on file  . Number of children: 4  . Years of education: Not on file  . Highest education level: Not on file  Occupational History  . Occupation: retired  Tobacco Use  . Smoking status: Former Smoker    Types: Cigarettes    Quit date: 09/02/1984    Years since quitting: 35.0  . Smokeless tobacco: Never Used  Substance and Sexual Activity  . Alcohol use: No  . Drug use: No  . Sexual activity: Not on file  Other Topics Concern  . Not on file  Social History Narrative  . Not on file   Social Determinants of Health   Financial Resource Strain:   . Difficulty of Paying Living Expenses: Not on file  Food Insecurity:   . Worried About Charity fundraiser in the Last Year: Not on file  . Ran Out of Food in the Last Year: Not on file  Transportation Needs:   . Lack of Transportation (Medical): Not on file  . Lack of Transportation (Non-Medical): Not on file  Physical Activity:   . Days of Exercise per Week: Not on file  . Minutes of Exercise per Session: Not on file  Stress:   . Feeling of Stress : Not on file  Social Connections:   . Frequency of Communication with Friends and Family: Not on file  . Frequency of Social Gatherings with Friends and Family: Not on file  . Attends Religious Services: Not on file  . Active Member of Clubs or Organizations: Not on file  . Attends Archivist Meetings: Not on file  . Marital Status: Not on file  Intimate Partner Violence:   . Fear of Current or Ex-Partner: Not on file  . Emotionally Abused: Not on file  . Physically Abused: Not on file  . Sexually Abused: Not on file      Review of systems: Review of Systems  Constitutional: Negative for fever and chills.  HENT: Negative.   Eyes: Negative for blurred vision.  Respiratory: Negative for cough, shortness of breath and  wheezing.   Cardiovascular: Negative for chest pain and palpitations.  Gastrointestinal: as per HPI Genitourinary: Negative for dysuria, urgency, frequency and hematuria.  Musculoskeletal: Negative for myalgias, back pain and joint pain.  Skin: Negative for itching and rash.  Neurological: Negative for dizziness, tremors, focal weakness, seizures and loss of consciousness.  Endo/Heme/Allergies: Negative Psychiatric/Behavioral: Negative for depression, suicidal ideas and hallucinations.  All other systems reviewed and are negative.   Physical Exam: Vitals:   09/22/19 1325  BP: 140/64  Pulse: 79   Body mass index is 31.58 kg/m.  Gen:      No acute distress HEENT:  EOMI, sclera anicteric Neck:     No masses; no thyromegaly Lungs:    Clear to auscultation bilaterally; normal respiratory effort CV:         Regular rate and rhythm; no murmurs Abd:      + bowel sounds; soft, non-tender; no palpable masses, no distension Ext:    No edema; adequate peripheral perfusion Skin:      Warm and dry; no rash Neuro: alert and oriented x 3 Psych: normal mood and affect  Data Reviewed:  Reviewed labs, radiology imaging, old records and pertinent past GI work up   Assessment and Plan/Recommendations:  74 year old female with history of chronic GERD and chronic idiopathic constipation with outlet dysfunction here for follow-up visit  GERD: Restart omeprazole 40 mg daily, patient will likely need to stay on omeprazole 40 mg daily for 2 to 3 months, subsequently will consider decreasing to 20 mg daily if her symptoms are stable Discussed antireflux measures Schedule for EGD to exclude erosive esophagitis or peptic ulcer disease with complaints of epigastric abdominal pain  Chronic idiopathic constipation with outlet dysfunction: Patient has failed over-the-counter laxatives, fiber supplements and MiraLAX. She had improvement with Linzess 72 mcg daily but was denied by insurance in the past.   Will resend prescription for Linzess 72 mcg daily Increase dietary fiber and fluid intake Patient does not want to undergo pelvic floor physical therapy or biofeedback for outlet dysfunction  History of adenomatous colon polyps greater than 1 cm in size, due for surveillance colonoscopy in March 2021, will schedule it The risks and benefits as well as alternatives of endoscopic procedure(s) have been discussed and reviewed. All questions answered. The patient agrees to proceed.'  We will request clearance from cardiology to hold Eliquis for 2 days prior to the procedure   The patient was provided an opportunity to ask questions and all were answered. The patient agreed with the plan and demonstrated an understanding of the instructions.  Damaris Hippo , MD    CC: Leonard Downing, *

## 2019-09-22 NOTE — Patient Instructions (Signed)
We have sent omeprazole to your pharmacy  We have sent Linzess to your pharmacy  You will be contacted by our office prior to your procedure for directions on holding your Eliquis.  If you do not hear from our office 1 week prior to your scheduled procedure, please call [phone] to discuss.     Gastroesophageal Reflux Disease, Adult Gastroesophageal reflux (GER) happens when acid from the stomach flows up into the tube that connects the mouth and the stomach (esophagus). Normally, food travels down the esophagus and stays in the stomach to be digested. However, when a person has GER, food and stomach acid sometimes move back up into the esophagus. If this becomes a more serious problem, the person may be diagnosed with a disease called gastroesophageal reflux disease (GERD). GERD occurs when the reflux:  Happens often.  Causes frequent or severe symptoms.  Causes problems such as damage to the esophagus. When stomach acid comes in contact with the esophagus, the acid may cause soreness (inflammation) in the esophagus. Over time, GERD may create small holes (ulcers) in the lining of the esophagus. What are the causes? This condition is caused by a problem with the muscle between the esophagus and the stomach (lower esophageal sphincter, or LES). Normally, the LES muscle closes after food passes through the esophagus to the stomach. When the LES is weakened or abnormal, it does not close properly, and that allows food and stomach acid to go back up into the esophagus. The LES can be weakened by certain dietary substances, medicines, and medical conditions, including:  Tobacco use.  Pregnancy.  Having a hiatal hernia.  Alcohol use.  Certain foods and beverages, such as coffee, chocolate, onions, and peppermint. What increases the risk? You are more likely to develop this condition if you:  Have an increased body weight.  Have a connective tissue disorder.  Use NSAID  medicines. What are the signs or symptoms? Symptoms of this condition include:  Heartburn.  Difficult or painful swallowing.  The feeling of having a lump in the throat.  Abitter taste in the mouth.  Bad breath.  Having a large amount of saliva.  Having an upset or bloated stomach.  Belching.  Chest pain. Different conditions can cause chest pain. Make sure you see your health care provider if you experience chest pain.  Shortness of breath or wheezing.  Ongoing (chronic) cough or a night-time cough.  Wearing away of tooth enamel.  Weight loss. How is this diagnosed? Your health care provider will take a medical history and perform a physical exam. To determine if you have mild or severe GERD, your health care provider may also monitor how you respond to treatment. You may also have tests, including:  A test to examine your stomach and esophagus with a small camera (endoscopy).  A test thatmeasures the acidity level in your esophagus.  A test thatmeasures how much pressure is on your esophagus.  A barium swallow or modified barium swallow test to show the shape, size, and functioning of your esophagus. How is this treated? The goal of treatment is to help relieve your symptoms and to prevent complications. Treatment for this condition may vary depending on how severe your symptoms are. Your health care provider may recommend:  Changes to your diet.  Medicine.  Surgery. Follow these instructions at home: Eating and drinking   Follow a diet as recommended by your health care provider. This may involve avoiding foods and drinks such as: ? Coffee and  tea (with or without caffeine). ? Drinks that containalcohol. ? Energy drinks and sports drinks. ? Carbonated drinks or sodas. ? Chocolate and cocoa. ? Peppermint and mint flavorings. ? Garlic and onions. ? Horseradish. ? Spicy and acidic foods, including peppers, chili powder, curry powder, vinegar, hot  sauces, and barbecue sauce. ? Citrus fruit juices and citrus fruits, such as oranges, lemons, and limes. ? Tomato-based foods, such as red sauce, chili, salsa, and pizza with red sauce. ? Fried and fatty foods, such as donuts, french fries, potato chips, and high-fat dressings. ? High-fat meats, such as hot dogs and fatty cuts of red and white meats, such as rib eye steak, sausage, ham, and bacon. ? High-fat dairy items, such as whole milk, butter, and cream cheese.  Eat small, frequent meals instead of large meals.  Avoid drinking large amounts of liquid with your meals.  Avoid eating meals during the 2-3 hours before bedtime.  Avoid lying down right after you eat.  Do not exercise right after you eat. Lifestyle   Do not use any products that contain nicotine or tobacco, such as cigarettes, e-cigarettes, and chewing tobacco. If you need help quitting, ask your health care provider.  Try to reduce your stress by using methods such as yoga or meditation. If you need help reducing stress, ask your health care provider.  If you are overweight, reduce your weight to an amount that is healthy for you. Ask your health care provider for guidance about a safe weight loss goal. General instructions  Pay attention to any changes in your symptoms.  Take over-the-counter and prescription medicines only as told by your health care provider. Do not take aspirin, ibuprofen, or other NSAIDs unless your health care provider told you to do so.  Wear loose-fitting clothing. Do not wear anything tight around your waist that causes pressure on your abdomen.  Raise (elevate) the head of your bed about 6 inches (15 cm).  Avoid bending over if this makes your symptoms worse.  Keep all follow-up visits as told by your health care provider. This is important. Contact a health care provider if:  You have: ? New symptoms. ? Unexplained weight loss. ? Difficulty swallowing or it hurts to  swallow. ? Wheezing or a persistent cough. ? A hoarse voice.  Your symptoms do not improve with treatment. Get help right away if you:  Have pain in your arms, neck, jaw, teeth, or back.  Feel sweaty, dizzy, or light-headed.  Have chest pain or shortness of breath.  Vomit and your vomit looks like blood or coffee grounds.  Faint.  Have stool that is bloody or black.  Cannot swallow, drink, or eat. Summary  Gastroesophageal reflux happens when acid from the stomach flows up into the esophagus. GERD is a disease in which the reflux happens often, causes frequent or severe symptoms, or causes problems such as damage to the esophagus.  Treatment for this condition may vary depending on how severe your symptoms are. Your health care provider may recommend diet and lifestyle changes, medicine, or surgery.  Contact a health care provider if you have new or worsening symptoms.  Take over-the-counter and prescription medicines only as told by your health care provider. Do not take aspirin, ibuprofen, or other NSAIDs unless your health care provider told you to do so.  Keep all follow-up visits as told by your health care provider. This is important. This information is not intended to replace advice given to you by your health  care provider. Make sure you discuss any questions you have with your health care provider. Document Revised: 02/25/2018 Document Reviewed: 02/25/2018 Elsevier Patient Education  [place].  I appreciate the  opportunity to care for you  Thank You   Harl Bowie , MD

## 2019-09-23 ENCOUNTER — Encounter: Payer: Self-pay | Admitting: *Deleted

## 2019-09-23 ENCOUNTER — Encounter: Payer: Self-pay | Admitting: Gastroenterology

## 2019-09-23 ENCOUNTER — Telehealth: Payer: Self-pay | Admitting: *Deleted

## 2019-09-23 NOTE — Telephone Encounter (Signed)
I called [facility] and lvm that we do not follow this pt. Informed pt is followed by [facility] and they will need to call [facility] for pre op clearance. I will also fax these notes to  GI. I will remove from the pre op call back pool.

## 2019-09-23 NOTE — Telephone Encounter (Signed)
[facility] Pre-operative Risk Assessment     Request for surgical clearance:     Endoscopy Procedure  What type of surgery is being performed?     Colon/Endo  When is this surgery scheduled?    3/16  What type of clearance is required ?   Pharmacy  Are there any medications that need to be held prior to surgery and how long? Eliquis   Practice name and name of physician performing surgery?      [facility]   [place]   What is your office phone and fax number?      Phone- [phone]  [phone]  Anesthesia type (None, local, MAC, general) ?       MAC

## 2019-09-23 NOTE — Telephone Encounter (Signed)
This pt is not followed in our office. Looks like [facility] callback, please inform the requesting provider.   I will remove from our preop pool.

## 2019-09-23 NOTE — Telephone Encounter (Signed)
  09/23/2019   RE: Dorothy Mcdonald DOB: [birth year] MRN: [id number]   To Whom it may concern:   We have scheduled the above patient for an endoscopic procedure. Our records show that she is on anticoagulation therapy.   Please advise as to how long the patient may come off her therapy of Eliquis prior to the procedure, which is scheduled for 11/16/2019 .  Please fax back/ or route the completed form to [facility] at [phone].   Sincerely,    Tonita Phoenix   Faxed to [facility] today

## 2019-09-23 NOTE — Telephone Encounter (Signed)
[facility] called and said they do not follow this patient.  She is currently being treated at [facility]

## 2019-09-30 ENCOUNTER — Telehealth: Payer: Self-pay | Admitting: *Deleted

## 2019-09-30 NOTE — Telephone Encounter (Signed)
09/30/2019   RE: Dorothy Mcdonald DOB: 11-05-1945 MRN: [id number]   To whom it may concern:  We have scheduled the above patient for an endoscopic procedure. Our records show that she is on anticoagulation therapy.   Please advise as to how long the patient may come off her therapy of Eliquis prior to the procedure, which is scheduled for 3/16  Please fax back/ or route the completed form to [facility] at [phone].   Sincerely,    Tonita Phoenix   Faxed letter to [facility] today  [phone]

## 2019-09-30 NOTE — Telephone Encounter (Signed)
Will fax request to [facility]

## 2019-10-25 NOTE — Telephone Encounter (Signed)
We received a fax that stated for her to hold the Eliquis 72 hours prior to the procedure. Patient has been informed and verbalized understanding. Fax will be sent to be scanned into epic.

## 2019-10-25 NOTE — Telephone Encounter (Signed)
Patient's [facility] electrophysiologist is:  Quay Burow, [facility] [address] [place], [place] [address]  Phone: [phone]  Fax: [phone]     Specific fax to [facility]: [phone]  Letter has been refaxed as it was previously faxed to the [phone] number which according to [facility] is the general cardiology fax number.

## 2019-10-26 NOTE — Telephone Encounter (Signed)
Please advised her to hold for only 48 hours prior to the procedure. Thank you

## 2019-10-26 NOTE — Telephone Encounter (Signed)
I have spoken to patient to advise that although electrophysiologist did give the okay to Eliquis hold longer (72 hours), Dr Silverio Decamp prefers she only hold Eliquis for 48 hours prior to her upcoming procedure on 11/16/19. This is typically the amount of time we hold Eliquis for a procedure. Patient verbalizes clear understanding that she should only hold the medication for 48 hours prior to her procedure. Please look under media for original letter.

## 2019-10-26 NOTE — Telephone Encounter (Signed)
Dr Silverio Decamp- Patient's electrophysiologist has asked that patient hold Eliquis 72 hours prior to upcoming procedure on 11/16/19. Dr Silverio Decamp, is this okay with you or do you still prefer 48 hours?

## 2019-11-11 ENCOUNTER — Encounter: Payer: Self-pay | Admitting: Gastroenterology

## 2019-11-12 ENCOUNTER — Telehealth: Payer: Self-pay

## 2019-11-15 ENCOUNTER — Telehealth: Payer: Self-pay | Admitting: Gastroenterology

## 2019-11-15 NOTE — Telephone Encounter (Signed)
A Fib history? Called patient back. Female answering the phone states "she just left a few minutes ago and will be back in a few minutes."

## 2019-11-15 NOTE — Telephone Encounter (Signed)
Patient called states she had an irregular heartbeat and it went up to 158 but it went down that has not happened for about a year now. She spoke with cardiologist office and they said she would be ok to proceed with procedure scheduled for 11/16/19.

## 2019-11-15 NOTE — Telephone Encounter (Signed)
Patient states she has not had the irregular beat in about a year. It did not last long. She did not have any CP, SOB or weakness with it. She spoke with cardiology. She was told she can go forward with her procedure. She is prepping.

## 2019-11-16 ENCOUNTER — Other Ambulatory Visit: Payer: Self-pay

## 2019-11-16 ENCOUNTER — Ambulatory Visit (AMBULATORY_SURGERY_CENTER): Payer: Medicare HMO | Admitting: Gastroenterology

## 2019-11-16 ENCOUNTER — Encounter: Payer: Self-pay | Admitting: Gastroenterology

## 2019-11-16 VITALS — BP 152/77 | HR 73 | Temp 97.3°F | Resp 13 | Ht 64.0 in | Wt 184.0 lb

## 2019-11-16 DIAGNOSIS — Z86010 Personal history of colonic polyps: Secondary | ICD-10-CM | POA: Diagnosis not present

## 2019-11-16 DIAGNOSIS — K219 Gastro-esophageal reflux disease without esophagitis: Secondary | ICD-10-CM | POA: Diagnosis present

## 2019-11-16 DIAGNOSIS — K3189 Other diseases of stomach and duodenum: Secondary | ICD-10-CM

## 2019-11-16 DIAGNOSIS — K449 Diaphragmatic hernia without obstruction or gangrene: Secondary | ICD-10-CM | POA: Diagnosis not present

## 2019-11-16 DIAGNOSIS — D120 Benign neoplasm of cecum: Secondary | ICD-10-CM

## 2019-11-16 DIAGNOSIS — D123 Benign neoplasm of transverse colon: Secondary | ICD-10-CM | POA: Diagnosis not present

## 2019-11-16 DIAGNOSIS — K2900 Acute gastritis without bleeding: Secondary | ICD-10-CM | POA: Diagnosis not present

## 2019-11-16 DIAGNOSIS — K5902 Outlet dysfunction constipation: Secondary | ICD-10-CM

## 2019-11-16 DIAGNOSIS — R1013 Epigastric pain: Secondary | ICD-10-CM

## 2019-11-16 MED ORDER — SODIUM CHLORIDE 0.9 % IV SOLN: 500.0000 mL | Freq: Once | INTRAVENOUS | Status: DC

## 2019-11-16 NOTE — Patient Instructions (Signed)
Discharge instructions given. Handouts on polyps,diverticulosis,hemorrhoids and Gastritis. Resume Eliquis at prior dose tomorrow. No aspirin,ibuprofen,naproxen, or other non-steroidal anti-inflammatory drugs. YOU HAD AN ENDOSCOPIC PROCEDURE TODAY AT [facility]:   Refer to the procedure report that was given to you for any specific questions about what was found during the examination.  If the procedure report does not answer your questions, please call your gastroenterologist to clarify.  If you requested that your care partner not be given the details of your procedure findings, then the procedure report has been included in a sealed envelope for you to review at your convenience later.  YOU SHOULD EXPECT: Some feelings of bloating in the abdomen. Passage of more gas than usual.  Walking can help get rid of the air that was put into your GI tract during the procedure and reduce the bloating. If you had a lower endoscopy (such as a colonoscopy or flexible sigmoidoscopy) you may notice spotting of blood in your stool or on the toilet paper. If you underwent a bowel prep for your procedure, you may not have a normal bowel movement for a few days.  Please Note:  You might notice some irritation and congestion in your nose or some drainage.  This is from the oxygen used during your procedure.  There is no need for concern and it should clear up in a day or so.  SYMPTOMS TO REPORT IMMEDIATELY:   Following lower endoscopy (colonoscopy or flexible sigmoidoscopy):  Excessive amounts of blood in the stool  Significant tenderness or worsening of abdominal pains  Swelling of the abdomen that is new, acute  Fever of 100F or higher   Following upper endoscopy (EGD)  Vomiting of blood or coffee ground material  New chest pain or pain under the shoulder blades  Painful or persistently difficult swallowing  New shortness of breath  Fever of 100F or higher  Black, tarry-looking  stools  For urgent or emergent issues, a gastroenterologist can be reached at any hour by calling [phone]. Do not use MyChart messaging for urgent concerns.    DIET:  We do recommend a small meal at first, but then you may proceed to your regular diet.  Drink plenty of fluids but you should avoid alcoholic beverages for 24 hours.  ACTIVITY:  You should plan to take it easy for the rest of today and you should NOT DRIVE or use heavy machinery until tomorrow (because of the sedation medicines used during the test).    FOLLOW UP: Our staff will call the number listed on your records 48-72 hours following your procedure to check on you and address any questions or concerns that you may have regarding the information given to you following your procedure. If we do not reach you, we will leave a message.  We will attempt to reach you two times.  During this call, we will ask if you have developed any symptoms of COVID 19. If you develop any symptoms (ie: fever, flu-like symptoms, shortness of breath, cough etc.) before then, please call [phone].  If you test positive for Covid 19 in the 2 weeks post procedure, please call and report this information to [place].    If any biopsies were taken you will be contacted by phone or by letter within the next 1-3 weeks.  Please call us at [phone] if you have not heard about the biopsies in 3 weeks.    SIGNATURES/CONFIDENTIALITY: You and/or your care partner have signed paperwork which  will be entered into your electronic medical record.  These signatures attest to the fact that that the information above on your After Visit Summary has been reviewed and is understood.  Full responsibility of the confidentiality of this discharge information lies with you and/or your care-partner.

## 2019-11-16 NOTE — Progress Notes (Signed)
Temp by LC, vitals by CW

## 2019-11-16 NOTE — Op Note (Signed)
[place] Patient Name: Dorothy Mcdonald Procedure Date: 11/16/2019 8:05 AM MRN: [id number] Endoscopist: Mauri Pole , MD Age: 74 Referring MD:  Date of Birth: 07-03-1946 Gender: Female Account #: [id number] Procedure:                Colonoscopy Indications:              High risk colon cancer surveillance: Personal                            history of colonic polyps, High risk colon cancer                            surveillance: Personal history of adenoma (10 mm or                            greater in size) Medicines:                Monitored Anesthesia Care Procedure:                Pre-Anesthesia Assessment:                           - Prior to the procedure, a History and Physical                            was performed, and patient medications and                            allergies were reviewed. The patient's tolerance of                            previous anesthesia was also reviewed. The risks                            and benefits of the procedure and the sedation                            options and risks were discussed with the patient.                            All questions were answered, and informed consent                            was obtained. Prior Anticoagulants: The patient                            last took Eliquis (apixaban) 2 days prior to the                            procedure. ASA Grade Assessment: II - A patient                            with mild systemic disease. After reviewing the  risks and benefits, the patient was deemed in                            satisfactory condition to undergo the procedure.                           After obtaining informed consent, the colonoscope                            was passed under direct vision. Throughout the                            procedure, the patient's blood pressure, pulse, and                            oxygen saturations were monitored  continuously. The                            Colonoscope was introduced through the anus and                            advanced to the the terminal ileum, with                            identification of the appendiceal orifice and IC                            valve. The colonoscopy was performed without                            difficulty. The patient tolerated the procedure                            well. The quality of the bowel preparation was                            excellent. The terminal ileum, ileocecal valve,                            appendiceal orifice, and rectum were photographed. Scope In: 8:22:14 AM Scope Out: 8:41:34 AM Scope Withdrawal Time: 0 hours 13 minutes 37 seconds  Total Procedure Duration: 0 hours 19 minutes 20 seconds  Findings:                 The perianal and digital rectal examinations were                            normal.                           Three sessile polyps were found in the transverse                            colon and ileocecal valve. The polyps were 4 to 7  mm in size. These polyps were removed with a cold                            snare. Resection and retrieval were complete.                           A few small-mouthed diverticula were found in the                            sigmoid colon.                           Non-bleeding internal hemorrhoids were found during                            retroflexion. The hemorrhoids were small. Complications:            No immediate complications. Estimated Blood Loss:     Estimated blood loss was minimal. Impression:               - Three 4 to 7 mm polyps in the transverse colon                            and at the ileocecal valve, removed with a cold                            snare. Resected and retrieved.                           - Diverticulosis in the sigmoid colon.                           - Non-bleeding internal hemorrhoids. Recommendation:            - Patient has a contact number available for                            emergencies. The signs and symptoms of potential                            delayed complications were discussed with the                            patient. Return to normal activities tomorrow.                            Written discharge instructions were provided to the                            patient.                           - Resume previous diet.                           - Continue present medications.                           -  Await pathology results.                           - Repeat colonoscopy in 3 - 5 years for                            surveillance based on pathology results.                           - Return to GI clinic in 3 months.                           - Resume Eliquis (apixaban) at prior dose tomorrow.                           - No aspirin, ibuprofen, naproxen, or other                            non-steroidal anti-inflammatory drugs. Mauri Pole, MD 11/16/2019 8:51:34 AM This report has been signed electronically.

## 2019-11-16 NOTE — Progress Notes (Signed)
Called to room to assist during endoscopic procedure.  Patient ID and intended procedure confirmed with present staff. Received instructions for my participation in the procedure from the performing physician.  

## 2019-11-16 NOTE — Op Note (Addendum)
[place] Patient Name: Adejah Skiff Procedure Date: 11/16/2019 8:05 AM MRN: [id number] Endoscopist: Mauri Pole , MD Age: 74 Referring MD:  Date of Birth: 1946/07/29 Gender: Female Account #: [id number] Procedure:                Upper GI endoscopy Indications:              Epigastric abdominal pain, Esophageal reflux                            symptoms that persist despite appropriate therapy Medicines:                Monitored Anesthesia Care Procedure:                Pre-Anesthesia Assessment:                           - Prior to the procedure, a History and Physical                            was performed, and patient medications and                            allergies were reviewed. The patient's tolerance of                            previous anesthesia was also reviewed. The risks                            and benefits of the procedure and the sedation                            options and risks were discussed with the patient.                            All questions were answered, and informed consent                            was obtained. Prior Anticoagulants: The patient                            last took Eliquis (apixaban) 2 days prior to the                            procedure. ASA Grade Assessment: II - A patient                            with mild systemic disease. After reviewing the                            risks and benefits, the patient was deemed in                            satisfactory condition to undergo the procedure.  After obtaining informed consent, the endoscope was                            passed under direct vision. Throughout the                            procedure, the patient's blood pressure, pulse, and                            oxygen saturations were monitored continuously. The                            Endoscope was introduced through the mouth, and   advanced to the second part of duodenum. The upper                            GI endoscopy was accomplished without difficulty.                            The patient tolerated the procedure well. Scope In: Scope Out: Findings:                 The Z-line was regular and was found 35 cm from the                            incisors.                           The gastroesophageal flap valve was visualized                            endoscopically and classified as Hill Grade IV (no                            fold, wide open lumen, hiatal hernia present).                           A small hiatal hernia was present.                           Patchy moderate inflammation with hemorrhage                            characterized by adherent blood, congestion (edema)                            and erythema was found in the entire examined                            stomach. Biopsies were taken with a cold forceps                            for Helicobacter pylori testing.  The examined duodenum was normal. Complications:            No immediate complications. Estimated Blood Loss:     Estimated blood loss was minimal. Impression:               - Z-line regular, 35 cm from the incisors.                           - Gastroesophageal flap valve classified as Hill                            Grade IV (no fold, wide open lumen, hiatal hernia                            present).                           - Small hiatal hernia.                           - Gastritis with hemorrhage. Biopsied.                           - Normal examined duodenum. Recommendation:           - Patient has a contact number available for                            emergencies. The signs and symptoms of potential                            delayed complications were discussed with the                            patient. Return to normal activities tomorrow.                            Written discharge  instructions were provided to the                            patient.                           - Resume previous diet.                           - Continue present medications.                           - Await pathology results.                           - See the other procedure note for documentation of                            additional recommendations.                           -  Follow an antireflux regimen. Mauri Pole, MD 11/16/2019 8:47:26 AM This report has been signed electronically.

## 2019-11-16 NOTE — Telephone Encounter (Signed)
Okay thank you

## 2019-11-16 NOTE — Progress Notes (Signed)
PT taken to PACU. Monitors in place. VSS. Report given to RN. 

## 2019-11-18 ENCOUNTER — Telehealth: Payer: Self-pay | Admitting: *Deleted

## 2019-11-18 NOTE — Telephone Encounter (Signed)
  Follow up Call-  Call back number 11/16/2019  Post procedure Call Back phone  # [phone]  Permission to leave phone message Yes  Some recent data might be hidden     Patient questions:  Do you have a fever, pain , or abdominal swelling? No. Pain Score  0 *  Have you tolerated food without any problems? Yes.    Have you been able to return to your normal activities? Yes.    Do you have any questions about your discharge instructions: Diet   No. Medications  No. Follow up visit  No.  Do you have questions or concerns about your Care? No.  Actions: * If pain score is 4 or above: No action needed, pain <4.  1. Have you developed a fever since your procedure? no  2.   Have you had an respiratory symptoms (SOB or cough) since your procedure? no  3.   Have you tested positive for COVID 19 since your procedure no  4.   Have you had any family members/close contacts diagnosed with the COVID 19 since your procedure?  no   If yes to any of these questions please route to Joylene John, RN and Alphonsa Gin, Therapist, sports.

## 2019-11-23 ENCOUNTER — Encounter: Payer: Self-pay | Admitting: Gastroenterology

## 2019-11-24 ENCOUNTER — Telehealth: Payer: Self-pay | Admitting: Gastroenterology

## 2019-11-24 NOTE — Telephone Encounter (Signed)
Colonoscopy procedure note has follow up in office visit in 3 months.  The doctor mailed a letter with her biopsy results yesterday. If the patient has concerns, she can come back sooner most certainly.

## 2019-11-24 NOTE — Telephone Encounter (Signed)
Pt inquired about colonoscopy results.  She also believes that she should follow up in 2 weeks post procedure but recall states FU 02/2020.

## 2020-03-20 ENCOUNTER — Ambulatory Visit: Payer: Medicare HMO | Admitting: Gastroenterology

## 2020-03-28 ENCOUNTER — Encounter: Payer: Self-pay | Admitting: Gastroenterology

## 2020-03-28 ENCOUNTER — Ambulatory Visit: Payer: Medicare HMO | Admitting: Gastroenterology

## 2020-03-28 VITALS — BP 116/70 | HR 81 | Ht 65.0 in | Wt 182.4 lb

## 2020-03-28 DIAGNOSIS — K5904 Chronic idiopathic constipation: Secondary | ICD-10-CM

## 2020-03-28 DIAGNOSIS — K219 Gastro-esophageal reflux disease without esophagitis: Secondary | ICD-10-CM | POA: Diagnosis not present

## 2020-03-28 MED ORDER — OMEPRAZOLE 40 MG PO CPDR: 40.0000 mg | DELAYED_RELEASE_CAPSULE | Freq: Every day | ORAL | 3 refills | Status: DC

## 2020-03-28 NOTE — Progress Notes (Signed)
Dorothy Mcdonald    [id number]    12-01-1945  Primary Care Physician:Elkins, [name], MD  Referring Physician: Leonard Downing, MD [address] [place],  [place] [address]   Chief complaint:  GERD, chronic constipation HPI: 74 year old very pleasant female here for follow-up visit  Linzess 72 mcg daily worked well when she took it for a month but her insurance does not cover it and her out of pocket expense is approximately 300/month She has tried multiple stool softeners and laxatives OTC senna, dulcolax, bulking agents, fiber with no significant improvement.  She continues to have intermittent constipation.  She had a severe episode of constipation last month where she was unable to defecate for few days, subsequently developed nausea and vomiting.  She has to occasionally digitally manipulate or disimpact  GERD symptoms under control with omeprazole daily  Colonoscopy November 16, 2019: 3 sessile polyps [tubular adenomas] removed.  Sigmoid diverticulosis and internal hemorrhoids. EGD November 16, 2019: Hiatal hernia, mild gastritis otherwise normal exam.  Biopsies showed mild reactive gastropathy, negative for H. pylori or dysplasia  Anorectal manometry March 2018 showed weak external anal sphincter, dyssynergy defecation.  Patient was referred to PT for biofeedback but she did not like doing it and did not complete the sessions.  Colonoscopy November 12, 2016: 2 sessile polyps ranging in size from 7 to 11 mm removed from ascending and descending colon, internal hemorrhoids otherwise normal exam. EGD November 12, 2016 showed hiatal hernia otherwise normal exam  Outpatient Encounter Medications as of 03/28/2020  Medication Sig  . apixaban (ELIQUIS) 5 MG TABS tablet Take 5 mg by mouth 2 (two) times daily.  . famotidine (PEPCID) 40 MG tablet Take 1 tablet (40 mg total) by mouth at bedtime.  . fluticasone (FLONASE) 50 MCG/ACT nasal spray INSTILL 2 SPRAYS IN EACH  NOSTRIL DAILY  . furosemide (LASIX) 20 MG tablet Take by mouth.  . linaclotide (LINZESS) 72 MCG capsule Take 1 capsule (72 mcg total) by mouth daily before breakfast.  . omeprazole (PRILOSEC) 40 MG capsule TAKE 1 CAPSULE BY MOUTH EVERY DAY  . omeprazole (PRILOSEC) 40 MG capsule Take 1 capsule (40 mg total) by mouth daily.  Marland Kitchen venlafaxine XR (EFFEXOR-XR) 150 MG 24 hr capsule Take 150 mg by mouth daily.   Facility-Administered Encounter Medications as of 03/28/2020  Medication  . 0.9 %  sodium chloride infusion  . 0.9 %  sodium chloride infusion    Allergies as of 03/28/2020 - Review Complete 03/28/2020  Allergen Reaction Noted  . Amoxicillin-pot clavulanate Nausea And Vomiting 11/19/2016  . Penicillins Nausea And Vomiting 08/31/2016    Past Medical History:  Diagnosis Date  . Adenomatous colon polyp 2003  . Arthritis   . Atrial fibrillation ([place])   . Gallstones   . Hepatic steatosis   . Hiatal hernia   . Hyperlipidemia   . IBS (irritable bowel syndrome)   . Internal hemorrhoids   . Irregular heart beat   . Peptic ulcer disease   . Rotator cuff disorder, left   . TMJ (temporomandibular joint disorder)   . Vitamin D deficiency     Past Surgical History:  Procedure Laterality Date  . ANAL RECTAL MANOMETRY N/A 11/22/2016   Procedure: ANO RECTAL MANOMETRY;  Surgeon: Mauri Pole, MD;  Location: [facility];  Service: Endoscopy;  Laterality: N/A;  . BREAST LUMPECTOMY Right   . CHOLECYSTECTOMY    . COLONOSCOPY    . HAND SURGERY  3 hand surgeries  . PARTIAL HYSTERECTOMY  1983  . POLYPECTOMY    . RECTOCELE AND CYSTOCELE REPAIR    . TONSILLECTOMY      Family History  Problem Relation Age of Onset  . Colon cancer Father        DIED AT 27  . Colon cancer Paternal Aunt   . Colon cancer Paternal Uncle        x 3  . Ovarian cancer Mother   . Uterine cancer Mother   . Breast cancer Sister   . Esophageal cancer Neg Hx   . Rectal cancer Neg Hx   . Stomach cancer  Neg Hx     Social History   Socioeconomic History  . Marital status: Married    Spouse name: Not on file  . Number of children: 4  . Years of education: Not on file  . Highest education level: Not on file  Occupational History  . Occupation: retired  Tobacco Use  . Smoking status: Former Smoker    Types: Cigarettes    Quit date: 09/02/1984    Years since quitting: 35.5  . Smokeless tobacco: Never Used  Substance and Sexual Activity  . Alcohol use: No  . Drug use: No  . Sexual activity: Not on file  Other Topics Concern  . Not on file  Social History Narrative  . Not on file   Social Determinants of Health   Financial Resource Strain:   . Difficulty of Paying Living Expenses:   Food Insecurity:   . Worried About Charity fundraiser in the Last Year:   . Arboriculturist in the Last Year:   Transportation Needs:   . Film/video editor (Medical):   Marland Kitchen Lack of Transportation (Non-Medical):   Physical Activity:   . Days of Exercise per Week:   . Minutes of Exercise per Session:   Stress:   . Feeling of Stress :   Social Connections:   . Frequency of Communication with Friends and Family:   . Frequency of Social Gatherings with Friends and Family:   . Attends Religious Services:   . Active Member of Clubs or Organizations:   . Attends Archivist Meetings:   Marland Kitchen Marital Status:   Intimate Partner Violence:   . Fear of Current or Ex-Partner:   . Emotionally Abused:   Marland Kitchen Physically Abused:   . Sexually Abused:       Review of systems:  All other review of systems negative except as mentioned in the HPI.   Physical Exam: Vitals:   03/28/20 1525  BP: 116/70  Pulse: 81   Body mass index is 30.35 kg/m. Gen:      No acute distress Neuro: alert and oriented x 3 Psych: normal mood and affect  Data Reviewed:  Reviewed labs, radiology imaging, old records and pertinent past GI work up   Assessment and Plan/Recommendations:  74 year old female  with chronic constipation secondary to outlet dysfunction/dyssynergic defecation, hiatal hernia and GERD  Chronic constipation with dyssynergic defecation No significant improvement with pelvic floor physical therapy High out-of-pocket cost for Linzess, patient was provided some samples today during office visit for 72 mcg daily Trial of MiraLAX 1 capful daily, titrate to have 1-2 bowel movements daily Continue Benefiber 1 tablespoon twice daily with meals Increase dietary fiber and fluid intake  Hiatal hernia GERD: Antireflux measures Continue omeprazole 40 mg daily  This visit required >30 minutes of patient care (this includes precharting, chart  review, review of results, face-to-face time used for counseling as well as treatment plan and follow-up. The patient was provided an opportunity to ask questions and all were answered. The patient agreed with the plan and demonstrated an understanding of the instructions.  Damaris Hippo , MD    CC: Leonard Downing, *

## 2020-03-28 NOTE — Patient Instructions (Addendum)
We will send Omeprazole 40 mg  to your pharmacy  Take Miralax 1 capful daily  We have given you Linzess 72 mcg samples today   Constipation, Adult Constipation is when a person:  Poops (has a bowel movement) fewer times in a week than normal.  Has a hard time pooping.  Has poop that is dry, hard, or bigger than normal. Follow these instructions at home: Eating and drinking   Eat foods that have a lot of fiber, such as: ? Fresh fruits and vegetables. ? Whole grains. ? Beans.  Eat less of foods that are high in fat, low in fiber, or overly processed, such as: ? [place] fries. ? Hamburgers. ? Cookies. ? Candy. ? Soda.  Drink enough fluid to keep your pee (urine) clear or pale yellow. General instructions  Exercise regularly or as told by your doctor.  Go to the restroom when you feel like you need to poop. Do not hold it in.  Take over-the-counter and prescription medicines only as told by your doctor. These include any fiber supplements.  Do pelvic floor retraining exercises, such as: ? Doing deep breathing while relaxing your lower belly (abdomen). ? Relaxing your pelvic floor while pooping.  Watch your condition for any changes.  Keep all follow-up visits as told by your doctor. This is important. Contact a doctor if:  You have pain that gets worse.  You have a fever.  You have not pooped for 4 days.  You throw up (vomit).  You are not hungry.  You lose weight.  You are bleeding from the anus.  You have thin, pencil-like poop (stool). Get help right away if:  You have a fever, and your symptoms suddenly get worse.  You leak poop or have blood in your poop.  Your belly feels hard or bigger than normal (is bloated).  You have very bad belly pain.  You feel dizzy or you faint. This information is not intended to replace advice given to you by your health care provider. Make sure you discuss any questions you have with your health care  provider. Document Revised: 08/01/2017 Document Reviewed: 02/07/2016 Elsevier Patient Education  [place].    Gastroesophageal Reflux Disease, Adult Gastroesophageal reflux (GER) happens when acid from the stomach flows up into the tube that connects the mouth and the stomach (esophagus). Normally, food travels down the esophagus and stays in the stomach to be digested. However, when a person has GER, food and stomach acid sometimes move back up into the esophagus. If this becomes a more serious problem, the person may be diagnosed with a disease called gastroesophageal reflux disease (GERD). GERD occurs when the reflux:  Happens often.  Causes frequent or severe symptoms.  Causes problems such as damage to the esophagus. When stomach acid comes in contact with the esophagus, the acid may cause soreness (inflammation) in the esophagus. Over time, GERD may create small holes (ulcers) in the lining of the esophagus. What are the causes? This condition is caused by a problem with the muscle between the esophagus and the stomach (lower esophageal sphincter, or LES). Normally, the LES muscle closes after food passes through the esophagus to the stomach. When the LES is weakened or abnormal, it does not close properly, and that allows food and stomach acid to go back up into the esophagus. The LES can be weakened by certain dietary substances, medicines, and medical conditions, including:  Tobacco use.  Pregnancy.  Having a hiatal hernia.  Alcohol  use.  Certain foods and beverages, such as coffee, chocolate, onions, and peppermint. What increases the risk? You are more likely to develop this condition if you:  Have an increased body weight.  Have a connective tissue disorder.  Use NSAID medicines. What are the signs or symptoms? Symptoms of this condition include:  Heartburn.  Difficult or painful swallowing.  The feeling of having a lump in the throat.  Abitter  taste in the mouth.  Bad breath.  Having a large amount of saliva.  Having an upset or bloated stomach.  Belching.  Chest pain. Different conditions can cause chest pain. Make sure you see your health care provider if you experience chest pain.  Shortness of breath or wheezing.  Ongoing (chronic) cough or a night-time cough.  Wearing away of tooth enamel.  Weight loss. How is this diagnosed? Your health care provider will take a medical history and perform a physical exam. To determine if you have mild or severe GERD, your health care provider may also monitor how you respond to treatment. You may also have tests, including:  A test to examine your stomach and esophagus with a small camera (endoscopy).  A test thatmeasures the acidity level in your esophagus.  A test thatmeasures how much pressure is on your esophagus.  A barium swallow or modified barium swallow test to show the shape, size, and functioning of your esophagus. How is this treated? The goal of treatment is to help relieve your symptoms and to prevent complications. Treatment for this condition may vary depending on how severe your symptoms are. Your health care provider may recommend:  Changes to your diet.  Medicine.  Surgery. Follow these instructions at home: Eating and drinking   Follow a diet as recommended by your health care provider. This may involve avoiding foods and drinks such as: ? Coffee and tea (with or without caffeine). ? Drinks that containalcohol. ? Energy drinks and sports drinks. ? Carbonated drinks or sodas. ? Chocolate and cocoa. ? Peppermint and mint flavorings. ? Garlic and onions. ? Horseradish. ? Spicy and acidic foods, including peppers, chili powder, curry powder, vinegar, hot sauces, and barbecue sauce. ? Citrus fruit juices and citrus fruits, such as oranges, lemons, and limes. ? Tomato-based foods, such as red sauce, chili, salsa, and pizza with red sauce. ? Fried  and fatty foods, such as donuts, french fries, potato chips, and high-fat dressings. ? High-fat meats, such as hot dogs and fatty cuts of red and white meats, such as rib eye steak, sausage, ham, and bacon. ? High-fat dairy items, such as whole milk, butter, and cream cheese.  Eat small, frequent meals instead of large meals.  Avoid drinking large amounts of liquid with your meals.  Avoid eating meals during the 2-3 hours before bedtime.  Avoid lying down right after you eat.  Do not exercise right after you eat. Lifestyle   Do not use any products that contain nicotine or tobacco, such as cigarettes, e-cigarettes, and chewing tobacco. If you need help quitting, ask your health care provider.  Try to reduce your stress by using methods such as yoga or meditation. If you need help reducing stress, ask your health care provider.  If you are overweight, reduce your weight to an amount that is healthy for you. Ask your health care provider for guidance about a safe weight loss goal. General instructions  Pay attention to any changes in your symptoms.  Take over-the-counter and prescription medicines only as told by  your health care provider. Do not take aspirin, ibuprofen, or other NSAIDs unless your health care provider told you to do so.  Wear loose-fitting clothing. Do not wear anything tight around your waist that causes pressure on your abdomen.  Raise (elevate) the head of your bed about 6 inches (15 cm).  Avoid bending over if this makes your symptoms worse.  Keep all follow-up visits as told by your health care provider. This is important. Contact a health care provider if:  You have: ? New symptoms. ? Unexplained weight loss. ? Difficulty swallowing or it hurts to swallow. ? Wheezing or a persistent cough. ? A hoarse voice.  Your symptoms do not improve with treatment. Get help right away if you:  Have pain in your arms, neck, jaw, teeth, or back.  Feel sweaty,  dizzy, or light-headed.  Have chest pain or shortness of breath.  Vomit and your vomit looks like blood or coffee grounds.  Faint.  Have stool that is bloody or black.  Cannot swallow, drink, or eat. Summary  Gastroesophageal reflux happens when acid from the stomach flows up into the esophagus. GERD is a disease in which the reflux happens often, causes frequent or severe symptoms, or causes problems such as damage to the esophagus.  Treatment for this condition may vary depending on how severe your symptoms are. Your health care provider may recommend diet and lifestyle changes, medicine, or surgery.  Contact a health care provider if you have new or worsening symptoms.  Take over-the-counter and prescription medicines only as told by your health care provider. Do not take aspirin, ibuprofen, or other NSAIDs unless your health care provider told you to do so.  Keep all follow-up visits as told by your health care provider. This is important. This information is not intended to replace advice given to you by your health care provider. Make sure you discuss any questions you have with your health care provider. Document Revised: 02/25/2018 Document Reviewed: 02/25/2018 Elsevier Patient Education  [place].  I appreciate the  opportunity to care for you  Thank You   Harl Bowie , MD

## 2020-03-29 ENCOUNTER — Encounter: Payer: Self-pay | Admitting: Gastroenterology

## 2021-02-23 ENCOUNTER — Observation Stay (HOSPITAL_COMMUNITY)
Admission: EM | Admit: 2021-02-23 | Discharge: 2021-02-26 | Disposition: A | Discharge status: Home / Self Care | Payer: Medicare HMO | Attending: Emergency Medicine | Admitting: Emergency Medicine

## 2021-02-23 ENCOUNTER — Other Ambulatory Visit: Payer: Self-pay

## 2021-02-23 ENCOUNTER — Emergency Department (HOSPITAL_COMMUNITY): Payer: Medicare HMO

## 2021-02-23 ENCOUNTER — Encounter (HOSPITAL_COMMUNITY): Payer: Self-pay | Admitting: Emergency Medicine

## 2021-02-23 DIAGNOSIS — K219 Gastro-esophageal reflux disease without esophagitis: Secondary | ICD-10-CM | POA: Diagnosis present

## 2021-02-23 DIAGNOSIS — R0602 Shortness of breath: Secondary | ICD-10-CM | POA: Insufficient documentation

## 2021-02-23 DIAGNOSIS — Z7901 Long term (current) use of anticoagulants: Secondary | ICD-10-CM | POA: Insufficient documentation

## 2021-02-23 DIAGNOSIS — R946 Abnormal results of thyroid function studies: Secondary | ICD-10-CM | POA: Insufficient documentation

## 2021-02-23 DIAGNOSIS — R002 Palpitations: Secondary | ICD-10-CM | POA: Diagnosis present

## 2021-02-23 DIAGNOSIS — R0789 Other chest pain: Secondary | ICD-10-CM

## 2021-02-23 DIAGNOSIS — R079 Chest pain, unspecified: Secondary | ICD-10-CM | POA: Diagnosis not present

## 2021-02-23 DIAGNOSIS — I480 Paroxysmal atrial fibrillation: Secondary | ICD-10-CM | POA: Diagnosis present

## 2021-02-23 DIAGNOSIS — Z87891 Personal history of nicotine dependence: Secondary | ICD-10-CM | POA: Insufficient documentation

## 2021-02-23 DIAGNOSIS — Z20822 Contact with and (suspected) exposure to covid-19: Secondary | ICD-10-CM | POA: Insufficient documentation

## 2021-02-23 DIAGNOSIS — Z79899 Other long term (current) drug therapy: Secondary | ICD-10-CM | POA: Insufficient documentation

## 2021-02-23 DIAGNOSIS — F419 Anxiety disorder, unspecified: Secondary | ICD-10-CM | POA: Diagnosis present

## 2021-02-23 DIAGNOSIS — E876 Hypokalemia: Secondary | ICD-10-CM | POA: Diagnosis not present

## 2021-02-23 DIAGNOSIS — E669 Obesity, unspecified: Secondary | ICD-10-CM | POA: Diagnosis not present

## 2021-02-23 HISTORY — DX: Gastro-esophageal reflux disease without esophagitis: K21.9

## 2021-02-23 HISTORY — DX: Cardiac arrhythmia, unspecified: I49.9

## 2021-02-23 HISTORY — DX: Anxiety disorder, unspecified: F41.9

## 2021-02-23 HISTORY — DX: Depression, unspecified: F32.A

## 2021-02-23 LAB — COMPREHENSIVE METABOLIC PANEL
ALT: 26 U/L (ref 0–44)
AST: 30 U/L (ref 15–41)
Albumin: 3.9 g/dL (ref 3.5–5.0)
Alkaline Phosphatase: 81 U/L (ref 38–126)
Anion gap: 11 (ref 5–15)
BUN: 19 mg/dL (ref 8–23)
CO2: 27 mmol/L (ref 22–32)
Calcium: 9.4 mg/dL (ref 8.9–10.3)
Chloride: 99 mmol/L (ref 98–111)
Creatinine, Ser: 0.91 mg/dL (ref 0.44–1.00)
GFR, Estimated: >60 mL/min (ref >60)
Glucose, Bld: 137 mg/dL — ABNORMAL HIGH (ref 70–99)
Potassium: 3.4 mmol/L — ABNORMAL LOW (ref 3.5–5.1)
Sodium: 137 mmol/L (ref 135–145)
Total Bilirubin: 0.7 mg/dL (ref 0.3–1.2)
Total Protein: 7.6 g/dL (ref 6.5–8.1)

## 2021-02-23 LAB — HEMOGLOBIN A1C
Hgb A1c MFr Bld: 5.8 % — ABNORMAL HIGH (ref 4.8–5.6)
Mean Plasma Glucose: 119.76 mg/dL

## 2021-02-23 LAB — CBC
HCT: 45 % (ref 36.0–46.0)
Hemoglobin: 14.9 g/dL (ref 12.0–15.0)
MCH: 29 pg (ref 26.0–34.0)
MCHC: 33.1 g/dL (ref 30.0–36.0)
MCV: 87.5 fL (ref 80.0–100.0)
Platelets: 288 10*3/uL (ref 150–400)
RBC: 5.14 MIL/uL — ABNORMAL HIGH (ref 3.87–5.11)
RDW: 13.2 % (ref 11.5–15.5)
WBC: 11.1 10*3/uL — ABNORMAL HIGH (ref 4.0–10.5)
nRBC: 0 % (ref 0.0–0.2)

## 2021-02-23 LAB — TROPONIN I (HIGH SENSITIVITY)
Troponin I (High Sensitivity): 7 ng/L (ref <18)
Troponin I (High Sensitivity): 9 ng/L (ref <18)

## 2021-02-23 LAB — SARS CORONAVIRUS 2 (TAT 6-24 HRS): SARS Coronavirus 2: NEGATIVE

## 2021-02-23 LAB — TSH: TSH: 0.177 u[IU]/mL — ABNORMAL LOW (ref 0.350–4.500)

## 2021-02-23 LAB — BRAIN NATRIURETIC PEPTIDE: B Natriuretic Peptide: 11.4 pg/mL (ref 0.0–100.0)

## 2021-02-23 MED ORDER — SODIUM CHLORIDE 0.9 % IV SOLN: 250.0000 mL | INTRAVENOUS | Status: DC | PRN

## 2021-02-23 MED ORDER — POTASSIUM CHLORIDE CRYS ER 20 MEQ PO TBCR
20.0000 meq | EXTENDED_RELEASE_TABLET | Freq: Once | ORAL | Status: AC
  Administered 2021-02-23: 20 meq via ORAL

## 2021-02-23 MED ORDER — SODIUM CHLORIDE 0.9% FLUSH: 3.0000 mL | INTRAVENOUS | Status: DC | PRN

## 2021-02-23 MED ORDER — NITROGLYCERIN 0.4 MG SL SUBL: 0.4000 mg | SUBLINGUAL_TABLET | SUBLINGUAL | Status: DC | PRN

## 2021-02-23 MED ORDER — SODIUM CHLORIDE 0.9% FLUSH
3.0000 mL | Freq: Two times a day (BID) | INTRAVENOUS | Status: DC
  Administered 2021-02-23 – 2021-02-26 (×5): 3 mL via INTRAVENOUS

## 2021-02-23 MED ORDER — ASPIRIN 81 MG PO CHEW
324.0000 mg | CHEWABLE_TABLET | Freq: Once | ORAL | Status: AC
  Administered 2021-02-23: 324 mg via ORAL
  Filled 2021-02-23: qty 4

## 2021-02-23 MED ORDER — VENLAFAXINE HCL ER 150 MG PO CP24
150.0000 mg | ORAL_CAPSULE | Freq: Every day | ORAL | Status: DC
  Filled 2021-02-23: qty 1

## 2021-02-23 MED ORDER — APIXABAN 5 MG PO TABS
5.0000 mg | ORAL_TABLET | Freq: Two times a day (BID) | ORAL | Status: DC
  Administered 2021-02-23 – 2021-02-26 (×6): 5 mg via ORAL
  Filled 2021-02-23 (×6): qty 1

## 2021-02-23 MED ORDER — FAMOTIDINE 20 MG PO TABS: 20.0000 mg | ORAL_TABLET | Freq: Every day | ORAL | Status: DC

## 2021-02-23 MED ORDER — PANTOPRAZOLE SODIUM 40 MG PO TBEC
40.0000 mg | DELAYED_RELEASE_TABLET | Freq: Two times a day (BID) | ORAL | Status: DC
  Administered 2021-02-23 – 2021-02-26 (×6): 40 mg via ORAL
  Filled 2021-02-23 (×6): qty 1

## 2021-02-23 MED ORDER — VENLAFAXINE HCL ER 150 MG PO CP24
150.0000 mg | ORAL_CAPSULE | Freq: Every day | ORAL | Status: DC
  Administered 2021-02-23 – 2021-02-25 (×3): 150 mg via ORAL
  Filled 2021-02-23 (×4): qty 1

## 2021-02-23 MED ORDER — FAMOTIDINE 20 MG PO TABS
20.0000 mg | ORAL_TABLET | Freq: Every day | ORAL | Status: DC
  Administered 2021-02-23 – 2021-02-25 (×3): 20 mg via ORAL
  Filled 2021-02-23 (×3): qty 1

## 2021-02-23 MED ORDER — POTASSIUM CHLORIDE CRYS ER 10 MEQ PO TBCR
EXTENDED_RELEASE_TABLET | ORAL | Status: AC
  Administered 2021-02-23: 20 meq via ORAL
  Filled 2021-02-23: qty 2

## 2021-02-23 MED ORDER — ZOLPIDEM TARTRATE 5 MG PO TABS
5.0000 mg | ORAL_TABLET | Freq: Every evening | ORAL | Status: DC | PRN
  Administered 2021-02-24 – 2021-02-25 (×2): 5 mg via ORAL
  Filled 2021-02-23 (×2): qty 1

## 2021-02-23 NOTE — ED Triage Notes (Signed)
Pt here from home with c/o chest tightness ans sob times 2 weeks , hx of afib , SR on arrival

## 2021-02-23 NOTE — ED Provider Notes (Signed)
[facility] EMERGENCY DEPARTMENT Provider Note   CSN: [id number] Arrival date & time: 02/23/21  1253     History No chief complaint on file.   Dorothy Mcdonald is a 75 y.o. female.  HPI 75 year old female presents with chest pressure and shortness of breath.  She has been feeling poorly for about a week.  She estimates that she has had on and off palpitations and some dyspnea for 5 days or so but it became worse yesterday where she had pressure all day.  Today it is better but she still not feeling well and was encouraged to come to the ER.  Had some leg swelling in her bilateral lower extremities and used her as needed Lasix 40 mg for the last 3 days.  This has improved.  Some little bit of cough but no fever.  Chest pressure is about a 7 out of 10.  Does not feel like she is in A. fib right now.  She does have a history of paroxysmal A. fib and is on Eliquis.  Past Medical History:  Diagnosis Date   Adenomatous colon polyp 2003   Arthritis    Atrial fibrillation (HCC)    Gallstones    Hepatic steatosis    Hiatal hernia    Hyperlipidemia    IBS (irritable bowel syndrome)    Internal hemorrhoids    Irregular heart beat    Peptic ulcer disease    Rotator cuff disorder, left    TMJ (temporomandibular joint disorder)    Vitamin D deficiency     Patient Active Problem List   Diagnosis Date Noted   Constipation due to outlet dysfunction     Past Surgical History:  Procedure Laterality Date   ANAL RECTAL MANOMETRY N/A 11/22/2016   Procedure: ANO RECTAL MANOMETRY;  Surgeon: Mauri Pole, MD;  Location: [facility];  Service: Endoscopy;  Laterality: N/A;   BREAST LUMPECTOMY Right    CHOLECYSTECTOMY     COLONOSCOPY     HAND SURGERY     3 hand surgeries   PARTIAL HYSTERECTOMY  1983   POLYPECTOMY     RECTOCELE AND CYSTOCELE REPAIR     TONSILLECTOMY       OB History   No obstetric history on file.     Family History  Problem Relation Age of Onset    Colon cancer Father        DIED AT 65   Colon cancer Paternal Aunt    Colon cancer Paternal Uncle        x 3   Ovarian cancer Mother    Uterine cancer Mother    Breast cancer Sister    Esophageal cancer Neg Hx    Rectal cancer Neg Hx    Stomach cancer Neg Hx     Social History   Tobacco Use   Smoking status: Former    Pack years: 0.00    Types: Cigarettes    Quit date: 09/02/1984    Years since quitting: 36.5   Smokeless tobacco: Never  Vaping Use   Vaping Use: Never used  Substance Use Topics   Alcohol use: No   Drug use: No    Home Medications Prior to Admission medications   Medication Sig Start Date End Date Taking? Authorizing Provider  apixaban (ELIQUIS) 5 MG TABS tablet Take 5 mg by mouth 2 (two) times daily. 07/02/19 03/28/20  [provider]  famotidine (PEPCID) 40 MG tablet Take 1 tablet (40 mg total) by mouth  at bedtime. 01/27/19   Mauri Pole, MD  fluticasone (FLONASE) 50 MCG/ACT nasal spray Place 2 sprays into both nostrils as needed.  04/17/19   [provider]  furosemide (LASIX) 20 MG tablet Take 20 mg by mouth as needed.     [provider]  linaclotide Rolan Lipa) 72 MCG capsule Take 1 capsule (72 mcg total) by mouth daily before breakfast. Patient not taking: Reported on 03/28/2020 09/22/19   Mauri Pole, MD  omeprazole (PRILOSEC) 40 MG capsule Take 1 capsule (40 mg total) by mouth daily. 09/22/19   Mauri Pole, MD  omeprazole (PRILOSEC) 40 MG capsule Take 1 capsule (40 mg total) by mouth daily. 03/28/20   Mauri Pole, MD  venlafaxine XR (EFFEXOR-XR) 150 MG 24 hr capsule Take 150 mg by mouth daily. 08/09/19   [provider]    Allergies    Amoxicillin-pot clavulanate and Penicillins  Review of Systems   Review of Systems  Constitutional:  Negative for fever.  Respiratory:  Positive for cough and shortness of breath.   Cardiovascular:  Positive for chest pain, palpitations and leg swelling.   Gastrointestinal:  Negative for abdominal pain.  All other systems reviewed and are negative.  Physical Exam Updated Vital Signs BP 132/73   Pulse 75   Temp 98.5 F (36.9 C) (Oral)   Resp 12   SpO2 98%   Physical Exam Vitals and nursing note reviewed.  Constitutional:      General: She is not in acute distress.    Appearance: She is well-developed. She is not ill-appearing or diaphoretic.  HENT:     Head: Normocephalic and atraumatic.     Right Ear: External ear normal.     Left Ear: External ear normal.     Nose: Nose normal.  Eyes:     General:        Right eye: No discharge.        Left eye: No discharge.  Cardiovascular:     Rate and Rhythm: Normal rate and regular rhythm.     Pulses:          Radial pulses are 2+ on the right side and 2+ on the left side.       Dorsalis pedis pulses are 2+ on the right side and 2+ on the left side.     Heart sounds: Normal heart sounds.  Pulmonary:     Effort: Pulmonary effort is normal.     Breath sounds: Normal breath sounds.  Abdominal:     Palpations: Abdomen is soft.     Tenderness: There is no abdominal tenderness.  Musculoskeletal:     Right lower leg: No edema.     Left lower leg: No edema.  Skin:    General: Skin is warm and dry.  Neurological:     Mental Status: She is alert.  Psychiatric:        Mood and Affect: Mood is not anxious.    ED Results / Procedures / Treatments   Labs (all labs ordered are listed, but only abnormal results are displayed) Labs Reviewed  COMPREHENSIVE METABOLIC PANEL - Abnormal; Notable for the following components:      Result Value   Potassium 3.4 (*)    Glucose, Bld 137 (*)    All other components within normal limits  CBC - Abnormal; Notable for the following components:   WBC 11.1 (*)    RBC 5.14 (*)    All other components within normal  limits  SARS CORONAVIRUS 2 (TAT 6-24 HRS)  BRAIN NATRIURETIC PEPTIDE  TROPONIN I (HIGH SENSITIVITY)  TROPONIN I (HIGH SENSITIVITY)     EKG EKG Interpretation  Date/Time:  Friday February 23 2021 12:59:25 EDT Ventricular Rate:  90 PR Interval:  148 QRS Duration: 76 QT Interval:  372 QTC Calculation: 455 R Axis:   54 Text Interpretation: Normal sinus rhythm with sinus arrhythmia diffuse nonspecific ST/T changes No old tracing to compare Confirmed by Sherwood Gambler [phone]) on 02/23/2021 1:55:31 PM  Radiology DG Chest Portable 1 View  Result Date: 02/23/2021 CLINICAL DATA:  Chest pain and shortness of breath with history of atrial fibrillation. EXAM: PORTABLE CHEST 1 VIEW COMPARISON:  August 31, 2016 FINDINGS: EKG leads project over the chest. Device, electronic device of some sort projecting over the central chest over the cardiac silhouette, likely external to the patient. Trachea midline. Cardiomediastinal contours and hilar structures are normal. Lungs are clear. No sign of effusion on frontal radiograph. No pneumothorax. On limited assessment no acute skeletal process. IMPRESSION: No acute cardiopulmonary disease. Electronically Signed   By: Zetta Bills M.D.   On: 02/23/2021 14:31    Procedures Procedures   Medications Ordered in ED Medications  nitroGLYCERIN (NITROSTAT) SL tablet 0.4 mg (has no administration in time range)  aspirin chewable tablet 324 mg (324 mg Oral Given 02/23/21 1455)    ED Course  I have reviewed the triage vital signs and the nursing notes.  Pertinent labs & imaging results that were available during my care of the patient were reviewed by me and considered in my medical decision making (see chart for details).    MDM Rules/Calculators/A&P                          Patient presents with chest pressure.  She does have some risk factors including hyperlipidemia and her age.  Doubt PE given she is compliant with Eliquis for A. fib.  She is not currently in A. fib.  She does have nonspecific T waves and combined with some exertional component yesterday, I think observation admission for  cardiac work-up is warranted.  Discussed with Dr. Rogers Blocker for admission. Final Clinical Impression(s) / ED Diagnoses Final diagnoses:  Nonspecific chest pain    Rx / DC Orders ED Discharge Orders     None        Sherwood Gambler, MD 02/23/21 1606

## 2021-02-23 NOTE — H&P (Signed)
History and Physical    [name]:[id number] DOB: July 25, 1946 DOA: 02/23/2021  PCP: Leonard Downing, MD Consultants:  cardiology: Dr. Marvel Plan at [facility], GI: [facility]  Patient coming from:  Home - lives with husband   Chief Complaint: chest pain and palpitations  HPI: Dorothy Mcdonald is a 75 y.o. female with medical history significant of paroxysmal atrial fibrillation, GERD, anxiety who presented to ED with dyspnea and palpitations with exertion as well as chest pain with exertion.  She states she really started to notice this 2 weeks ago. She had ILR that was removed a few months ago. She now has a holter monitor on now x 7 days ordered by her PCP. She denies any chest pain with exertion, but does have a chest heaviness with exertion and she feels like she can't breath. She states when she walks her heart rate will go up to 140s at times per her apple watch. She states when her heart rate is really high she will break out in a sweat. As soon as she sits down it heart rate drops.  She had some mild leg swelling in her legs, but took her lasix x 3 days and this helped. No significant weight gain over the past few weeks. Can't recall last echo. NO hx of heart cath or stress testing. She denies any N/V/D, abdominal pain, fever. No coughing or other URI symptoms.    ED Course: vitals on arrival: bp: 160/87, HR: 91, RR: 16, oxgyen 99% [place] and afebrile. BP repeat: 132/73 and oxygen 98% on RA. Lab work overall unremarkable, initial troponin negative. Ekg with NSR with non specific ST changes, but no prior tracing to compare to. Due to risk factors and chest pain asked to admit for chest pain work up.   Review of Systems: As per HPI; otherwise review of systems reviewed and negative.   Ambulatory Status:  Ambulates without assistance    Past Medical History:  Diagnosis Date   Adenomatous colon polyp 2003   Arthritis    Atrial fibrillation (HCC)    Gallstones    Hepatic steatosis     Hiatal hernia    Hyperlipidemia    IBS (irritable bowel syndrome)    Internal hemorrhoids    Irregular heart beat    Peptic ulcer disease    Rotator cuff disorder, left    TMJ (temporomandibular joint disorder)    Vitamin D deficiency     Past Surgical History:  Procedure Laterality Date   ANAL RECTAL MANOMETRY N/A 11/22/2016   Procedure: ANO RECTAL MANOMETRY;  Surgeon: Mauri Pole, MD;  Location: [facility];  Service: Endoscopy;  Laterality: N/A;   BREAST LUMPECTOMY Right    CHOLECYSTECTOMY     COLONOSCOPY     HAND SURGERY     3 hand surgeries   PARTIAL HYSTERECTOMY  1983   POLYPECTOMY     RECTOCELE AND CYSTOCELE REPAIR     TONSILLECTOMY      Social History   Socioeconomic History   Marital status: Married    Spouse name: Not on file   Number of children: 4   Years of education: Not on file   Highest education level: Not on file  Occupational History   Occupation: retired  Tobacco Use   Smoking status: Former    Pack years: 0.00    Types: Cigarettes    Quit date: 09/02/1984    Years since quitting: 36.5   Smokeless tobacco: Never  Vaping Use   Vaping Use:  Never used  Substance and Sexual Activity   Alcohol use: No   Drug use: No   Sexual activity: Not on file  Other Topics Concern   Not on file  Social History Narrative   Not on file   Social Determinants of Health   Financial Resource Strain: Not on file  Food Insecurity: Not on file  Transportation Needs: Not on file  Physical Activity: Not on file  Stress: Not on file  Social Connections: Not on file  Intimate Partner Violence: Not on file    Allergies  Allergen Reactions   Amoxicillin-Pot Clavulanate Nausea And Vomiting    None listed per [place].   Penicillins Nausea And Vomiting    Family History  Problem Relation Age of Onset   Colon cancer Father        DIED AT 25   Colon cancer Paternal Aunt    Colon cancer Paternal Uncle        x 3   Ovarian cancer  Mother    Uterine cancer Mother    Breast cancer Sister    Esophageal cancer Neg Hx    Rectal cancer Neg Hx    Stomach cancer Neg Hx     Prior to Admission medications   Medication Sig Start Date End Date Taking? Authorizing Provider  apixaban (ELIQUIS) 5 MG TABS tablet Take 5 mg by mouth 2 (two) times daily. 07/02/19 03/28/20  [provider]  famotidine (PEPCID) 40 MG tablet Take 1 tablet (40 mg total) by mouth at bedtime. 01/27/19   Mauri Pole, MD  fluticasone (FLONASE) 50 MCG/ACT nasal spray Place 2 sprays into both nostrils as needed.  04/17/19   [provider]  furosemide (LASIX) 20 MG tablet Take 20 mg by mouth as needed.     [provider]  linaclotide Rolan Lipa) 72 MCG capsule Take 1 capsule (72 mcg total) by mouth daily before breakfast. Patient not taking: Reported on 03/28/2020 09/22/19   Mauri Pole, MD  omeprazole (PRILOSEC) 40 MG capsule Take 1 capsule (40 mg total) by mouth daily. 09/22/19   Mauri Pole, MD  omeprazole (PRILOSEC) 40 MG capsule Take 1 capsule (40 mg total) by mouth daily. 03/28/20   Mauri Pole, MD  venlafaxine XR (EFFEXOR-XR) 150 MG 24 hr capsule Take 150 mg by mouth daily. 08/09/19   [provider]    Physical Exam: Vitals:   02/23/21 1257 02/23/21 1400 02/23/21 1430  BP: (!) 160/87 (!) 143/81 132/73  Pulse: 91 84 75  Resp: 16 20 12   Temp: 98.5 F (36.9 C)    TempSrc: Oral    SpO2: 99% 97% 98%     General:  Appears calm and comfortable and is in NAD Eyes:  PERRL, EOMI, normal lids, iris ENT:  grossly normal hearing, lips & tongue, mmm; appropriate dentition Neck:  no LAD, masses or thyromegaly; no carotid bruits Cardiovascular:  RRR, no m/r/g. No LE edema.  Respiratory:   CTA bilaterally with no wheezes/rales/rhonchi.  Normal respiratory effort. Abdomen:  soft, NT, ND, NABS Back:   normal alignment, no CVAT Skin:  no rash or induration seen on limited exam Musculoskeletal:   grossly normal tone BUE/BLE, good ROM, no bony abnormality Lower extremity:  No LE edema.  Limited foot exam with no ulcerations.  2+ distal pulses. Psychiatric:  grossly normal mood and affect, speech fluent and appropriate, AOx3 Neurologic:  CN 2-12 grossly intact, moves all extremities in coordinated fashion, sensation intact  Radiological Exams on Admission: Independently reviewed - see discussion in A/P where applicable  DG Chest Portable 1 View  Result Date: 02/23/2021 CLINICAL DATA:  Chest pain and shortness of breath with history of atrial fibrillation. EXAM: PORTABLE CHEST 1 VIEW COMPARISON:  August 31, 2016 FINDINGS: EKG leads project over the chest. Device, electronic device of some sort projecting over the central chest over the cardiac silhouette, likely external to the patient. Trachea midline. Cardiomediastinal contours and hilar structures are normal. Lungs are clear. No sign of effusion on frontal radiograph. No pneumothorax. On limited assessment no acute skeletal process. IMPRESSION: No acute cardiopulmonary disease. Electronically Signed   By: Zetta Bills M.D.   On: 02/23/2021 14:31    EKG: Independently reviewed.  NSR with rate 90; nonspecific ST changes with no evidence of acute ischemia. No old tracing to compare to.    Labs on Admission: I have personally reviewed the available labs and imaging studies at the time of the admission.  Pertinent labs:  Sugar: 137 Potassium: 3.4 Troponin negative x 1 Covid: pending     Assessment/Plan Principal Problem:   Chest pain -has risks and hx with anginal type symptoms with pressure with exertion. Given ASA in ER.  -troponin negative, serial pending -ekg with non specific findings, but no prior to compare this to -keep on tele -lipid panel pending. Hx of myalgia with a statin in the past.  -echo ordered -once back can decide if needs stress test inpatient, has cardiologist at [facility] -In no pain on exam. NG prn.    Active Problems:   Paroxysmal atrial fibrillation (HCC) In NSR, continue eliquis and tele -has heart monitor until Tuesday and hx of ILR removed a few months ago. -TSH pending     Palpitations Tsh pending, continue telemetry -has heart monitor on for 7 days until Tuesday  -echo ordered     Anxiety -continue home effexor     Gastroesophageal reflux disease without esophagitis -continue home medication   Hypokalemia -repleted, recheck in AM. Likely from 3 high doses of lasix. Did not continue her prn lasix.    There is no height or weight on file to calculate BMI.    Level of care: Telemetry Medical DVT prophylaxis:  continue eliquis Code Status:  Full - confirmed with patient Family Communication: Daughter in room: Leonides Sake Disposition Plan:  The patient is from: home  Anticipated d/c is to: home without [facility] services once her cardiology issues have been resolved. Consults: none Admission status:  observation    Orma Flaming MD [facility]   How to contact the [facility] Attending or Consulting provider [place] or covering provider during after hours [place], for this patient?  Check the care team in [facility] and look for a) attending/consulting [facility] provider listed and b) the [facility] team listed Log into [email or web address] and use [place]'s universal password to access. If you do not have the password, please contact the hospital operator. Locate the [facility] provider you are looking for under [facility] and page to a number that you can be directly reached. If you still have difficulty reaching the provider, please page the [facility] (Director on Call) for the Hospitalists listed on amion for assistance.   02/23/2021, 5:09 PM

## 2021-02-23 NOTE — Plan of Care (Signed)

## 2021-02-23 NOTE — ED Provider Notes (Signed)
Emergency Medicine Provider Triage Evaluation Note  Dorothy Mcdonald , a 75 y.o. female  was evaluated in triage.  Pt complains of palpitations and shortness of breath with varying heart rates over the course of the past few days.  She was advised by her family medicine provider to come to the ED for evaluation.  She states that she has had chest pressure and feels as though something is sitting on her chest x1 day.  She had a 5 pound weight gain leading up to her most recent encounter with a primary care provider, but then was treated with furosemide with good effect.  Patient has a history of paroxysmal atrial fibrillation followed by Dr. Marvel Plan with [facility].  Review of Systems  Positive: CP, SOB, palpitations, weight gain Negative: Fevers, hx blood clots, cough  Physical Exam  BP (!) 160/87 (BP Location: Left Arm)   Pulse 91   Temp 98.5 F (36.9 C) (Oral)   Resp 16   SpO2 99%  Gen:   Awake, no distress   Resp:  Normal effort  MSK:   Moves extremities without difficulty  Other:    Medical Decision Making  Medically screening exam initiated at 1:08 PM.  Appropriate orders placed.  Dorothy Mcdonald was informed that the remainder of the evaluation will be completed by another provider, this initial triage assessment does not replace that evaluation, and the importance of remaining in the ED until their evaluation is complete.    Corena Herter, PA-C 02/23/21 1312    Quintella Reichert, MD 02/23/21 1433

## 2021-02-24 ENCOUNTER — Observation Stay (HOSPITAL_BASED_OUTPATIENT_CLINIC_OR_DEPARTMENT_OTHER): Payer: Medicare HMO

## 2021-02-24 DIAGNOSIS — R079 Chest pain, unspecified: Secondary | ICD-10-CM | POA: Diagnosis not present

## 2021-02-24 DIAGNOSIS — R002 Palpitations: Secondary | ICD-10-CM | POA: Diagnosis not present

## 2021-02-24 DIAGNOSIS — I480 Paroxysmal atrial fibrillation: Secondary | ICD-10-CM | POA: Diagnosis not present

## 2021-02-24 DIAGNOSIS — R0789 Other chest pain: Secondary | ICD-10-CM | POA: Diagnosis not present

## 2021-02-24 LAB — BASIC METABOLIC PANEL
Anion gap: 12 (ref 5–15)
BUN: 19 mg/dL (ref 8–23)
CO2: 27 mmol/L (ref 22–32)
Calcium: 9.3 mg/dL (ref 8.9–10.3)
Chloride: 97 mmol/L — ABNORMAL LOW (ref 98–111)
Creatinine, Ser: 0.86 mg/dL (ref 0.44–1.00)
GFR, Estimated: >60 mL/min (ref >60)
Glucose, Bld: 113 mg/dL — ABNORMAL HIGH (ref 70–99)
Potassium: 3.9 mmol/L (ref 3.5–5.1)
Sodium: 136 mmol/L (ref 135–145)

## 2021-02-24 LAB — LIPID PANEL
Cholesterol: 272 mg/dL — ABNORMAL HIGH (ref 0–200)
HDL: 67 mg/dL (ref >40)
LDL Cholesterol: 180 mg/dL — ABNORMAL HIGH (ref 0–99)
Total CHOL/HDL Ratio: 4.1 RATIO
Triglycerides: 124 mg/dL (ref <150)
VLDL: 25 mg/dL (ref 0–40)

## 2021-02-24 LAB — ECHOCARDIOGRAM COMPLETE
AR max vel: 1.68 cm2
AV Area VTI: 1.69 cm2
AV Area mean vel: 1.64 cm2
AV Mean grad: 5 mmHg
AV Peak grad: 9.4 mmHg
Ao pk vel: 1.53 m/s
Area-P 1/2: 3.31 cm2
Height: 65 in
S' Lateral: 2.3 cm
Weight: 3040 oz

## 2021-02-24 LAB — T4, FREE: Free T4: 0.65 ng/dL (ref 0.61–1.12)

## 2021-02-24 NOTE — Progress Notes (Signed)
  Echocardiogram 2D Echocardiogram has been performed.  Merrie Roof F 02/24/2021, 12:28 PM

## 2021-02-24 NOTE — Care Management Obs Status (Signed)
[place] NOTIFICATION   Patient Details  Name: Dorothy Mcdonald MRN: [id number] Date of Birth: Nov 24, 1945   Medicare Observation Status Notification Given:  Yes    Bethena Roys, RN 02/24/2021, 2:26 PM

## 2021-02-24 NOTE — Progress Notes (Signed)
PROGRESS NOTE    Jorge Amparo   UXN:[id number]  DOB: 1945-10-29  PCP: Leonard Downing, MD    DOA: 02/23/2021 LOS: 0   Assessment & Plan   Principal Problem:   Chest pain Active Problems:   Gastroesophageal reflux disease without esophagitis   Paroxysmal atrial fibrillation (HCC)   Anxiety   Palpitations   Angina with exertional tachycardia - Hx of Paroxysmal A-fib - in sinus on monitor (6/25)  ECG 6/24 normal sinus with sinus arrhythmia, 90 bpm. No acute ischemic changes. Troponin normal x 2. Echo 6/25: normal with EF 60-65%, no regional wall motion abnormalities, normal diastolic function. Lipids: chol 272, LDL 180, HDL 67, TG 124 Hbg A1c: 5.8% 6/25: pt continues to have exertional chest pain/pressure and tachycardia. Pt denies personal hx of CAD. --Consult cardiology --Pt and family request stress test while here --Telemetry --Not on statin due to prior myalgias; recommend try pravastatin if not yet tried. Patient follows with cardiology at [facility]  Hypokalemia - replaced.  Monitor and replace as needed.  Home PRN Lasix on hold.  Palpitations - due to above; free T4 normal although TSH little depressed.  Has ambulatory heart monitor in place - follow up as scheduled.   Echo this admission normal, as above.  Generalized anxiety- continue Effexor  GERD without esophagitis - continue home med   Abnormal TSH - depressed TSH at 0.177 but free T4 within normal limits.  Repeat labs outpatient in several weeks.   Patient BMI: Body mass index is 31.62 kg/m.   DVT prophylaxis:  apixaban (ELIQUIS) tablet 5 mg   Diet:  Diet Orders (From admission, onward)     Start     Ordered   02/23/21 1701  Diet Heart Room service appropriate? Yes; Fluid consistency: Thin  Diet effective now       Question Answer Comment  Room service appropriate? Yes   Fluid consistency: Thin      02/23/21 1701              Code Status: Full Code   Brief Narrative / Hospital  Course to Date:   75 y.o. female with medical history significant of paroxysmal atrial fibrillation, GERD, anxiety who presented to ED with dyspnea and palpitations, dyspnea and chest pain described as pressure with exertion for about past two weeks.  Wearing ambulatory heart monitor ordered by PCP, and had recently had ILR removed after no events for year or more.   Initial evaluation: BP 160/87, HR: 91 Labs overall unremarkable, troponin x 2 negative. ECG with NSR with non specific ST changes, but no prior tracing to compare to; no acute ischemic changes.   Due to risk factors and chest pain asked to admit for chest pain work up.      Subjective 02/24/21    Pt continues to have chest pressure, SOB and heart racing when she gets up just to ambulate to bathroom. States she recovers with rest fairly quickly.  Asks about having stress test.  Awaiting echo.  Denies other acute complaints.    Disposition Plan & Communication   Status is: Observation  The patient remains OBS appropriate and will d/c before 2 midnights.  Dispo: The patient is from: Home              Anticipated d/c is to: Home              Patient currently is not medically stable to d/c.   Difficult to place patient No  Family Communication: husband and daughter at bedside on rounds today   Consults, Procedures, Significant Events   Consultants:  None  Procedures:  None  Antimicrobials:  Anti-infectives (From admission, onward)    None         Micro    Objective   Vitals:   02/24/21 0500 02/24/21 0600 02/24/21 0616 02/24/21 1430  BP:   121/75   Pulse: 77 85 76 75  Resp:    18  Temp:    (!) 97.2 F (36.2 C)  TempSrc:    Oral  SpO2: 97% 98% 96%   Weight:      Height:       No intake or output data in the 24 hours ending 02/24/21 1653 Filed Weights   02/23/21 2116  Weight: 86.2 kg    Physical Exam:  General exam: awake, alert, no acute distress HEENT: atraumatic, clear conjunctiva,  anicteric sclera, moist mucus membranes, hearing grossly normal  Respiratory system: CTAB, no wheezes, rales or rhonchi, normal respiratory effort. Cardiovascular system: normal S1/S2, tachycardic, regular rhythm, no JVD, murmurs, rubs, gallops, no peripheral edema.   Gastrointestinal system: soft, NT, ND,  Central nervous system: A&O x3. no gross focal neurologic deficits, normal speech Extremities: moves all, no edema, normal tone Skin: dry, intact, normal temperature, No rashes, lesions or ulcers seen on visualized skin Psychiatry: normal mood, congruent affect, judgement and insight appear normal  Labs   Data Reviewed: I have personally reviewed following labs and imaging studies  CBC: Recent Labs  Lab 02/23/21 1317  WBC 11.1*  HGB 14.9  HCT 45.0  MCV 87.5  PLT 124   Basic Metabolic Panel: Recent Labs  Lab 02/23/21 1317 02/24/21 0217  NA 137 136  K 3.4* 3.9  CL 99 97*  CO2 27 27  GLUCOSE 137* 113*  BUN 19 19  CREATININE 0.91 0.86  CALCIUM 9.4 9.3   GFR: Estimated Creatinine Clearance: 64.1 mL/min (by C-G formula based on SCr of 0.86 mg/dL). Liver Function Tests: Recent Labs  Lab 02/23/21 1317  AST 30  ALT 26  ALKPHOS 81  BILITOT 0.7  PROT 7.6  ALBUMIN 3.9   No results for input(s): LIPASE, AMYLASE in the last 168 hours. No results for input(s): AMMONIA in the last 168 hours. Coagulation Profile: No results for input(s): INR, PROTIME in the last 168 hours. Cardiac Enzymes: No results for input(s): CKTOTAL, CKMB, CKMBINDEX, TROPONINI in the last 168 hours. BNP (last 3 results) No results for input(s): PROBNP in the last 8760 hours. HbA1C: Recent Labs    02/23/21 1954  HGBA1C 5.8*   CBG: No results for input(s): GLUCAP in the last 168 hours. Lipid Profile: Recent Labs    02/24/21 0217  CHOL 272*  HDL 67  LDLCALC 180*  TRIG 124  CHOLHDL 4.1   Thyroid Function Tests: Recent Labs    02/23/21 1954 02/24/21 1140  TSH 0.177*  --   FREET4  --   0.65   Anemia Panel: No results for input(s): VITAMINB12, FOLATE, FERRITIN, TIBC, IRON, RETICCTPCT in the last 72 hours. Sepsis Labs: No results for input(s): PROCALCITON, LATICACIDVEN in the last 168 hours.  Recent Results (from the past 240 hour(s))  SARS CORONAVIRUS 2 (TAT 6-24 HRS) Nasopharyngeal Nasopharyngeal Swab     Status: None   Collection Time: 02/23/21  2:49 PM   Specimen: Nasopharyngeal Swab  Result Value Ref Range Status   SARS Coronavirus 2 NEGATIVE NEGATIVE Final    Comment: (NOTE) SARS-CoV-2 target nucleic  acids are NOT DETECTED.  The SARS-CoV-2 RNA is generally detectable in upper and lower respiratory specimens during the acute phase of infection. Negative results do not preclude SARS-CoV-2 infection, do not rule out co-infections with other pathogens, and should not be used as the sole basis for treatment or other patient management decisions. Negative results must be combined with clinical observations, patient history, and epidemiological information. The expected result is Negative.  Fact Sheet for Patients: SugarRoll.be  Fact Sheet for Healthcare Providers: https://www.woods-mathews.com/  This test is not yet approved or cleared by the [place] FDA and  has been authorized for detection and/or diagnosis of SARS-CoV-2 by FDA under an Emergency Use Authorization (EUA). This EUA will remain  in effect (meaning this test can be used) for the duration of the COVID-19 declaration under Se ction 564(b)(1) of the Act, 21 U.S.C. section 360bbb-3(b)(1), unless the authorization is terminated or revoked sooner.  Performed at [facility], Manuel Garcia [address], [place], [place] [address]       Imaging Studies   DG Chest Portable 1 View  Result Date: 02/23/2021 CLINICAL DATA:  Chest pain and shortness of breath with history of atrial fibrillation. EXAM: PORTABLE CHEST 1 VIEW COMPARISON:  August 31, 2016  FINDINGS: EKG leads project over the chest. Device, electronic device of some sort projecting over the central chest over the cardiac silhouette, likely external to the patient. Trachea midline. Cardiomediastinal contours and hilar structures are normal. Lungs are clear. No sign of effusion on frontal radiograph. No pneumothorax. On limited assessment no acute skeletal process. IMPRESSION: No acute cardiopulmonary disease. Electronically Signed   By: Zetta Bills M.D.   On: 02/23/2021 14:31   ECHOCARDIOGRAM COMPLETE  Result Date: 02/24/2021    ECHOCARDIOGRAM REPORT   Patient Name:   LORALEI RADCLIFFE Date of Exam: 02/24/2021 Medical Rec #:  [id number]     Height:       65.0 in Accession #:    [id number]    Weight:       190.0 lb Date of Birth:  05/21/1946    BSA:          1.935 m Patient Age:    75 years      BP:           121/75 mmHg Patient Gender: F             HR:           75 bpm. Exam Location:  Inpatient Procedure: 2D Echo, Cardiac Doppler and Color Doppler Indications:    R07.9* Chest pain, unspecified  History:        Patient has prior history of Echocardiogram examinations, most                 recent 01/31/2016. Arrythmias:Atrial Fibrillation.  Sonographer:    Merrie Roof RDCS Referring Phys: [id number] [place]  1. Left ventricular ejection fraction, by estimation, is 60 to 65%. The left ventricle has normal function. The left ventricle has no regional wall motion abnormalities. There is mild left ventricular hypertrophy. Left ventricular diastolic parameters were normal.  2. Right ventricular systolic function is normal. The right ventricular size is normal.  3. The mitral valve is normal in structure. Trivial mitral valve regurgitation. No evidence of mitral stenosis.  4. The aortic valve is tricuspid. Aortic valve regurgitation is trivial. Mild aortic valve sclerosis is present, with no evidence of aortic valve stenosis.  5. The inferior vena cava is normal in size  with greater than 50%  respiratory variability, suggesting right atrial pressure of 3 mmHg. FINDINGS  Left Ventricle: Left ventricular ejection fraction, by estimation, is 60 to 65%. The left ventricle has normal function. The left ventricle has no regional wall motion abnormalities. The left ventricular internal cavity size was normal in size. There is  mild left ventricular hypertrophy. Left ventricular diastolic parameters were normal. Right Ventricle: The right ventricular size is normal. No increase in right ventricular wall thickness. Right ventricular systolic function is normal. Left Atrium: Left atrial size was normal in size. Right Atrium: Right atrial size was normal in size. Pericardium: There is no evidence of pericardial effusion. Mitral Valve: The mitral valve is normal in structure. Trivial mitral valve regurgitation. No evidence of mitral valve stenosis. Tricuspid Valve: The tricuspid valve is normal in structure. Tricuspid valve regurgitation is trivial. Aortic Valve: The aortic valve is tricuspid. Aortic valve regurgitation is trivial. Mild aortic valve sclerosis is present, with no evidence of aortic valve stenosis. Aortic valve mean gradient measures 5.0 mmHg. Aortic valve peak gradient measures 9.4 mmHg. Aortic valve area, by VTI measures 1.69 cm. Pulmonic Valve: The pulmonic valve was not well visualized. Pulmonic valve regurgitation is not visualized. Aorta: The aortic root and ascending aorta are structurally normal, with no evidence of dilitation. Venous: The inferior vena cava is normal in size with greater than 50% respiratory variability, suggesting right atrial pressure of 3 mmHg. IAS/Shunts: The interatrial septum was not well visualized.  LEFT VENTRICLE PLAX 2D LVIDd:         3.80 cm  Diastology LVIDs:         2.30 cm  LV e' medial:    9.57 cm/s LV PW:         1.10 cm  LV E/e' medial:  8.6 LV IVS:        1.20 cm  LV e' lateral:   11.30 cm/s LVOT diam:     1.80 cm  LV E/e' lateral: 7.3 LV SV:         56 LV  SV Index:   29 LVOT Area:     2.54 cm  RIGHT VENTRICLE RV Basal diam:  3.50 cm LEFT ATRIUM           Index       RIGHT ATRIUM           Index LA diam:      3.40 cm 1.76 cm/m  RA Area:     14.70 cm LA Vol (A4C): 66.8 ml 34.51 ml/m RA Volume:   36.80 ml  19.01 ml/m  AORTIC VALVE AV Area (Vmax):    1.68 cm AV Area (Vmean):   1.64 cm AV Area (VTI):     1.69 cm AV Vmax:           153.00 cm/s AV Vmean:          110.000 cm/s AV VTI:            0.332 m AV Peak Grad:      9.4 mmHg AV Mean Grad:      5.0 mmHg LVOT Vmax:         101.00 cm/s LVOT Vmean:        70.700 cm/s LVOT VTI:          0.221 m LVOT/AV VTI ratio: 0.67  AORTA Ao Root diam: 3.70 cm Ao Asc diam:  3.20 cm MITRAL VALVE MV Area (PHT): 3.31 cm     SHUNTS MV Decel  Time: 229 msec     Systemic VTI:  0.22 m MV E velocity: 82.30 cm/s   Systemic Diam: 1.80 cm MV A velocity: 112.00 cm/s MV E/A ratio:  0.73 Oswaldo Milian MD Electronically signed by Oswaldo Milian MD Signature Date/Time: 02/24/2021/1:42:17 PM    Final      Medications   Scheduled Meds:  apixaban  5 mg Oral BID   famotidine  20 mg Oral QHS   pantoprazole  40 mg Oral BID   sodium chloride flush  3 mL Intravenous Q12H   venlafaxine XR  150 mg Oral QHS   Continuous Infusions:  sodium chloride         LOS: 0 days    Time spent: 35 minutes    Ezekiel Slocumb, DO [facility]  02/24/2021, 4:53 PM      If 7PM-7AM, please contact night-coverage. How to contact the [facility] Attending or Consulting provider [place] or covering provider during after hours [place], for this patient?    Check the care team in [facility] and look for a) attending/consulting [facility] provider listed and b) the [facility] team listed Log into [email or web address] and use [place]'s universal password to access. If you do not have the password, please contact the hospital operator. Locate the [facility] provider you are looking for under [facility] and page to a number that you can be directly  reached. If you still have difficulty reaching the provider, please page the [facility] (Director on Call) for the Hospitalists listed on amion for assistance.

## 2021-02-25 DIAGNOSIS — R0789 Other chest pain: Secondary | ICD-10-CM | POA: Diagnosis not present

## 2021-02-25 LAB — CBC
HCT: 39.6 % (ref 36.0–46.0)
Hemoglobin: 12.6 g/dL (ref 12.0–15.0)
MCH: 28.4 pg (ref 26.0–34.0)
MCHC: 31.8 g/dL (ref 30.0–36.0)
MCV: 89.4 fL (ref 80.0–100.0)
Platelets: 239 10*3/uL (ref 150–400)
RBC: 4.43 MIL/uL (ref 3.87–5.11)
RDW: 13.2 % (ref 11.5–15.5)
WBC: 10.4 10*3/uL (ref 4.0–10.5)
nRBC: 0 % (ref 0.0–0.2)

## 2021-02-25 LAB — BASIC METABOLIC PANEL
Anion gap: 6 (ref 5–15)
BUN: 16 mg/dL (ref 8–23)
CO2: 29 mmol/L (ref 22–32)
Calcium: 9.2 mg/dL (ref 8.9–10.3)
Chloride: 106 mmol/L (ref 98–111)
Creatinine, Ser: 0.84 mg/dL (ref 0.44–1.00)
GFR, Estimated: >60 mL/min (ref >60)
Glucose, Bld: 130 mg/dL — ABNORMAL HIGH (ref 70–99)
Potassium: 3.9 mmol/L (ref 3.5–5.1)
Sodium: 141 mmol/L (ref 135–145)

## 2021-02-25 LAB — MAGNESIUM: Magnesium: 1.9 mg/dL (ref 1.7–2.4)

## 2021-02-25 MED ORDER — METOPROLOL SUCCINATE ER 25 MG PO TB24
25.0000 mg | ORAL_TABLET | Freq: Every day | ORAL | Status: DC
  Administered 2021-02-25 – 2021-02-26 (×2): 25 mg via ORAL
  Filled 2021-02-25 (×2): qty 1

## 2021-02-25 MED ORDER — ROSUVASTATIN CALCIUM 5 MG PO TABS
10.0000 mg | ORAL_TABLET | Freq: Every day | ORAL | Status: DC
  Administered 2021-02-25 – 2021-02-26 (×2): 10 mg via ORAL
  Filled 2021-02-25 (×2): qty 2

## 2021-02-25 NOTE — Progress Notes (Addendum)
PROGRESS NOTE    Dorothy Mcdonald   ACZ:[id number]  DOB: Sep 13, 1945  PCP: Leonard Downing, MD    DOA: 02/23/2021 LOS: 0   Assessment & Plan   Principal Problem:   Chest pain Active Problems:   Gastroesophageal reflux disease without esophagitis   Paroxysmal atrial fibrillation (HCC)   Anxiety   Palpitations   Stable Angina with exertional tachycardia - Hx of Paroxysmal A-fib - in sinus on monitor (6/25)  ECG 6/24 normal sinus with sinus arrhythmia, 90 bpm. No acute ischemic changes. Troponin normal x 2. Echo 6/25: normal with EF 60-65%, no regional wall motion abnormalities, normal diastolic function. Lipids: chol 272, LDL 180, HDL 67, TG 124 Hbg A1c: 5.8% 6/25: pt continues to have exertional chest pain/pressure and tachycardia. Pt denies personal hx of CAD. --Cardiology consulted, appreciate recs --Pt and family request inpatient stress test (no availability over weekend).  Lexiscan planned for tomorrow 6/27. --Telemetry --Started on Toprol-XL and Crestor Patient follows with cardiology at [facility].  Hypokalemia - replaced.  Monitor and replace as needed.  Home PRN Lasix on hold.  Palpitations - due to above; free T4 normal although TSH little depressed.  Has ambulatory heart monitor in place - follow up as scheduled.   Echo this admission normal, as above.  Hyperlipidemia - prior intolerance to Lipitor (myalgias).  Started on Crestor per cardiology.  Monitor for side effects.   Generalized anxiety- continue Effexor  GERD without esophagitis - continue home med   Abnormal TSH - depressed TSH at 0.177 but free T4 within normal limits.  Repeat labs outpatient in several weeks.   Patient BMI: Body mass index is 31.62 kg/m.   DVT prophylaxis:  apixaban (ELIQUIS) tablet 5 mg   Diet:  Diet Orders (From admission, onward)     Start     Ordered   02/25/21 1044  Diet Heart Room service appropriate? Yes; Fluid consistency: Thin  Diet effective now        Question Answer Comment  Room service appropriate? Yes   Fluid consistency: Thin      02/25/21 1044              Code Status: Full Code   Brief Narrative / Hospital Course to Date:   75 y.o. female with medical history significant of paroxysmal atrial fibrillation, GERD, anxiety who presented to ED with dyspnea and palpitations, dyspnea and chest pain described as pressure with exertion for about past two weeks.  Wearing ambulatory heart monitor ordered by PCP, and had recently had ILR removed after no events for year or more.   Initial evaluation: BP 160/87, HR: 91 Labs overall unremarkable, troponin x 2 negative. ECG with NSR with non specific ST changes, but no prior tracing to compare to; no acute ischemic changes.   Due to risk factors and chest pain, patient admitted for further monitoring and evaluation.  Cardiology consulted and plans for stress test on Monday.      Subjective 02/25/21    Pt seen with husband and daughter at bedside.  She reports ongoing chest pressure, SOB and heart racing anytime she's not laying still.  Continues to recover with rest.  No other acute complaints.     Disposition Plan & Communication   Status is: Observation  The patient remains OBS appropriate and will d/c before 2 midnights.  Evaluation pending: stress test not available over weekend.  Dispo: The patient is from: Home  Anticipated d/c is to: Home              Patient currently is not medically stable to d/c.   Difficult to place patient No   Family Communication: husband and daughter at bedside on rounds today   Consults, Procedures, Significant Events   Consultants:  None  Procedures:  None  Antimicrobials:  Anti-infectives (From admission, onward)    None         Micro    Objective   Vitals:   02/24/21 2124 02/25/21 0657 02/25/21 0732 02/25/21 1128  BP: (!) 147/69 129/68 135/63 (!) 149/73  Pulse:   69 79  Resp: 18 17 18 20   Temp: 98.2  F (36.8 C) 98.3 F (36.8 C) 98.3 F (36.8 C) 98.3 F (36.8 C)  TempSrc: Oral Oral Oral Oral  SpO2: 97% 97% 98%   Weight:      Height:        Intake/Output Summary (Last 24 hours) at 02/25/2021 1548 Last data filed at 02/25/2021 0734 Gross per 24 hour  Intake 0 ml  Output --  Net 0 ml   Filed Weights   02/23/21 2116  Weight: 86.2 kg    Physical Exam:  General exam: awake, alert, no acute distress Respiratory system: normal respiratory effort, on room air with spO2 upper 90's. Cardiovascular system: tachycardic, no peripheral edema.   Central nervous system: A&O x3. no gross focal neurologic deficits, normal speech Extremities: moves all, no edema, normal tone Psychiatry: normal mood, congruent affect, judgement and insight appear normal  Labs   Data Reviewed: I have personally reviewed following labs and imaging studies  CBC: Recent Labs  Lab 02/23/21 1317 02/25/21 0254  WBC 11.1* 10.4  HGB 14.9 12.6  HCT 45.0 39.6  MCV 87.5 89.4  PLT 288 017   Basic Metabolic Panel: Recent Labs  Lab 02/23/21 1317 02/24/21 0217 02/25/21 0254  NA 137 136 141  K 3.4* 3.9 3.9  CL 99 97* 106  CO2 27 27 29   GLUCOSE 137* 113* 130*  BUN 19 19 16   CREATININE 0.91 0.86 0.84  CALCIUM 9.4 9.3 9.2  MG  --   --  1.9   GFR: Estimated Creatinine Clearance: 65.7 mL/min (by C-G formula based on SCr of 0.84 mg/dL). Liver Function Tests: Recent Labs  Lab 02/23/21 1317  AST 30  ALT 26  ALKPHOS 81  BILITOT 0.7  PROT 7.6  ALBUMIN 3.9   No results for input(s): LIPASE, AMYLASE in the last 168 hours. No results for input(s): AMMONIA in the last 168 hours. Coagulation Profile: No results for input(s): INR, PROTIME in the last 168 hours. Cardiac Enzymes: No results for input(s): CKTOTAL, CKMB, CKMBINDEX, TROPONINI in the last 168 hours. BNP (last 3 results) No results for input(s): PROBNP in the last 8760 hours. HbA1C: Recent Labs    02/23/21 1954  HGBA1C 5.8*   CBG: No  results for input(s): GLUCAP in the last 168 hours. Lipid Profile: Recent Labs    02/24/21 0217  CHOL 272*  HDL 67  LDLCALC 180*  TRIG 124  CHOLHDL 4.1   Thyroid Function Tests: Recent Labs    02/23/21 1954 02/24/21 1140  TSH 0.177*  --   FREET4  --  0.65   Anemia Panel: No results for input(s): VITAMINB12, FOLATE, FERRITIN, TIBC, IRON, RETICCTPCT in the last 72 hours. Sepsis Labs: No results for input(s): PROCALCITON, LATICACIDVEN in the last 168 hours.  Recent Results (from the past 240 hour(s))  SARS CORONAVIRUS 2 (TAT 6-24 HRS) Nasopharyngeal Nasopharyngeal Swab     Status: None   Collection Time: 02/23/21  2:49 PM   Specimen: Nasopharyngeal Swab  Result Value Ref Range Status   SARS Coronavirus 2 NEGATIVE NEGATIVE Final    Comment: (NOTE) SARS-CoV-2 target nucleic acids are NOT DETECTED.  The SARS-CoV-2 RNA is generally detectable in upper and lower respiratory specimens during the acute phase of infection. Negative results do not preclude SARS-CoV-2 infection, do not rule out co-infections with other pathogens, and should not be used as the sole basis for treatment or other patient management decisions. Negative results must be combined with clinical observations, patient history, and epidemiological information. The expected result is Negative.  Fact Sheet for Patients: SugarRoll.be  Fact Sheet for Healthcare Providers: https://www.woods-mathews.com/  This test is not yet approved or cleared by the [place] FDA and  has been authorized for detection and/or diagnosis of SARS-CoV-2 by FDA under an Emergency Use Authorization (EUA). This EUA will remain  in effect (meaning this test can be used) for the duration of the COVID-19 declaration under Se ction 564(b)(1) of the Act, 21 U.S.C. section 360bbb-3(b)(1), unless the authorization is terminated or revoked sooner.  Performed at [facility], [place]  [address] [place], [place] [address]       Imaging Studies   ECHOCARDIOGRAM COMPLETE  Result Date: 02/24/2021    ECHOCARDIOGRAM REPORT   Patient Name:   Dorothy Mcdonald Date of Exam: 02/24/2021 Medical Rec #:  [id number]     Height:       65.0 in Accession #:    [id number]    Weight:       190.0 lb Date of Birth:  1945/09/04    BSA:          1.935 m Patient Age:    75 years      BP:           121/75 mmHg Patient Gender: F             HR:           75 bpm. Exam Location:  Inpatient Procedure: 2D Echo, Cardiac Doppler and Color Doppler Indications:    R07.9* Chest pain, unspecified  History:        Patient has prior history of Echocardiogram examinations, most                 recent 01/31/2016. Arrythmias:Atrial Fibrillation.  Sonographer:    Merrie Roof RDCS Referring Phys: [id number] [place]  1. Left ventricular ejection fraction, by estimation, is 60 to 65%. The left ventricle has normal function. The left ventricle has no regional wall motion abnormalities. There is mild left ventricular hypertrophy. Left ventricular diastolic parameters were normal.  2. Right ventricular systolic function is normal. The right ventricular size is normal.  3. The mitral valve is normal in structure. Trivial mitral valve regurgitation. No evidence of mitral stenosis.  4. The aortic valve is tricuspid. Aortic valve regurgitation is trivial. Mild aortic valve sclerosis is present, with no evidence of aortic valve stenosis.  5. The inferior vena cava is normal in size with greater than 50% respiratory variability, suggesting right atrial pressure of 3 mmHg. FINDINGS  Left Ventricle: Left ventricular ejection fraction, by estimation, is 60 to 65%. The left ventricle has normal function. The left ventricle has no regional wall motion abnormalities. The left ventricular internal cavity size was normal in size. There is  mild left ventricular hypertrophy.  Left ventricular diastolic parameters were normal. Right Ventricle: The  right ventricular size is normal. No increase in right ventricular wall thickness. Right ventricular systolic function is normal. Left Atrium: Left atrial size was normal in size. Right Atrium: Right atrial size was normal in size. Pericardium: There is no evidence of pericardial effusion. Mitral Valve: The mitral valve is normal in structure. Trivial mitral valve regurgitation. No evidence of mitral valve stenosis. Tricuspid Valve: The tricuspid valve is normal in structure. Tricuspid valve regurgitation is trivial. Aortic Valve: The aortic valve is tricuspid. Aortic valve regurgitation is trivial. Mild aortic valve sclerosis is present, with no evidence of aortic valve stenosis. Aortic valve mean gradient measures 5.0 mmHg. Aortic valve peak gradient measures 9.4 mmHg. Aortic valve area, by VTI measures 1.69 cm. Pulmonic Valve: The pulmonic valve was not well visualized. Pulmonic valve regurgitation is not visualized. Aorta: The aortic root and ascending aorta are structurally normal, with no evidence of dilitation. Venous: The inferior vena cava is normal in size with greater than 50% respiratory variability, suggesting right atrial pressure of 3 mmHg. IAS/Shunts: The interatrial septum was not well visualized.  LEFT VENTRICLE PLAX 2D LVIDd:         3.80 cm  Diastology LVIDs:         2.30 cm  LV e' medial:    9.57 cm/s LV PW:         1.10 cm  LV E/e' medial:  8.6 LV IVS:        1.20 cm  LV e' lateral:   11.30 cm/s LVOT diam:     1.80 cm  LV E/e' lateral: 7.3 LV SV:         56 LV SV Index:   29 LVOT Area:     2.54 cm  RIGHT VENTRICLE RV Basal diam:  3.50 cm LEFT ATRIUM           Index       RIGHT ATRIUM           Index LA diam:      3.40 cm 1.76 cm/m  RA Area:     14.70 cm LA Vol (A4C): 66.8 ml 34.51 ml/m RA Volume:   36.80 ml  19.01 ml/m  AORTIC VALVE AV Area (Vmax):    1.68 cm AV Area (Vmean):   1.64 cm AV Area (VTI):     1.69 cm AV Vmax:           153.00 cm/s AV Vmean:          110.000 cm/s AV VTI:             0.332 m AV Peak Grad:      9.4 mmHg AV Mean Grad:      5.0 mmHg LVOT Vmax:         101.00 cm/s LVOT Vmean:        70.700 cm/s LVOT VTI:          0.221 m LVOT/AV VTI ratio: 0.67  AORTA Ao Root diam: 3.70 cm Ao Asc diam:  3.20 cm MITRAL VALVE MV Area (PHT): 3.31 cm     SHUNTS MV Decel Time: 229 msec     Systemic VTI:  0.22 m MV E velocity: 82.30 cm/s   Systemic Diam: 1.80 cm MV A velocity: 112.00 cm/s MV E/A ratio:  0.73 Oswaldo Milian MD Electronically signed by Oswaldo Milian MD Signature Date/Time: 02/24/2021/1:42:17 PM    Final      Medications   Scheduled  Meds:  apixaban  5 mg Oral BID   famotidine  20 mg Oral QHS   metoprolol succinate  25 mg Oral Daily   pantoprazole  40 mg Oral BID   rosuvastatin  10 mg Oral Daily   sodium chloride flush  3 mL Intravenous Q12H   venlafaxine XR  150 mg Oral QHS   Continuous Infusions:  sodium chloride         LOS: 0 days    Time spent: 30 minutes with > 50% spent at bedside and in coordination of care.    Ezekiel Slocumb, DO [facility]  02/25/2021, 3:48 PM      If 7PM-7AM, please contact night-coverage. How to contact the [facility] Attending or Consulting provider [place] or covering provider during after hours [place], for this patient?    Check the care team in [facility] and look for a) attending/consulting [facility] provider listed and b) the [facility] team listed Log into [email or web address] and use 's universal password to access. If you do not have the password, please contact the hospital operator. Locate the [facility] provider you are looking for under [facility] and page to a number that you can be directly reached. If you still have difficulty reaching the provider, please page the [facility] (Director on Call) for the Hospitalists listed on amion for assistance.

## 2021-02-25 NOTE — Consult Note (Signed)
CARDIOLOGY CONSULT NOTE  Patient ID: Dorothy Mcdonald MRN: [id number] DOB/AGE: May 13, 1946 75 y.o.  Admit date: 02/23/2021 Referring Physician: [facility] Reason for Consultation: Chest pain  HPI:   75 y.o. Caucasian female  with GERD, paroxysmal atrial fibrillation, now admitted with chest pain.  Patient's husband and daughter are at bedside.  Daughter works with patient's outpatient cardiologist Dr. Marvel Plan and [facility].  Patient last saw Dr. Marvel Plan 10 months ago.  For last 3 weeks, she has been having retrosternal chest pressure with episodes of tachycardia in 130s and 140s.  Her heart rate goes up rapidly with minimal activity.  Her heart rate and chest pressure both come down with resting.  She does not have any chest pressure at rest.  Denies any significant shortness of breath.  Her physical activity is also been limited due to knee pain.  Work-up showed no acute ischemic changes on EKG.  High-sensitivity troponins were negative x2.  Patient requested inpatient work-up.  Echocardiogram was unremarkable.  Stress test could not be done over the weekend due to nonavailability.  Patient has no paroxysmal atrial fibrillation.  She underwent ILR placement in 2018, explanted in 2021.  No A. fib burden seen on ILR while in place.  She had been on warfarin previously, now on Eliquis 5 mg twice daily.  Patient last smoked >30 yrs ago, drinks alcohol only occasionally. She drinks 4 diet cokes or tea everyday.   Past Medical History:  Diagnosis Date   Adenomatous colon polyp 2003   Anxiety    Arthritis    Atrial fibrillation (HCC)    Depression    Dysrhythmia    Gallstones    GERD (gastroesophageal reflux disease)    Hepatic steatosis    Hiatal hernia    Hyperlipidemia    IBS (irritable bowel syndrome)    Internal hemorrhoids    Irregular heart beat    Peptic ulcer disease    Rotator cuff disorder, left    TMJ (temporomandibular joint disorder)    Vitamin D deficiency       Past Surgical History:  Procedure Laterality Date   ANAL RECTAL MANOMETRY N/A 11/22/2016   Procedure: ANO RECTAL MANOMETRY;  Surgeon: Mauri Pole, MD;  Location: [facility];  Service: Endoscopy;  Laterality: N/A;   BREAST LUMPECTOMY Right    CHOLECYSTECTOMY     COLONOSCOPY     HAND SURGERY     3 hand surgeries   PARTIAL HYSTERECTOMY  1983   POLYPECTOMY     RECTOCELE AND CYSTOCELE REPAIR     TONSILLECTOMY        Family History  Problem Relation Age of Onset   Colon cancer Father        DIED AT 31   Colon cancer Paternal Aunt    Colon cancer Paternal Uncle        x 3   Ovarian cancer Mother    Uterine cancer Mother    Breast cancer Sister    Esophageal cancer Neg Hx    Rectal cancer Neg Hx    Stomach cancer Neg Hx      Social History: Social History   Socioeconomic History   Marital status: Married    Spouse name: Not on file   Number of children: 4   Years of education: Not on file   Highest education level: Not on file  Occupational History   Occupation: retired  Tobacco Use   Smoking status: Former    Pack years: 0.00  Types: Cigarettes    Quit date: 09/02/1984    Years since quitting: 36.5   Smokeless tobacco: Never  Vaping Use   Vaping Use: Never used  Substance and Sexual Activity   Alcohol use: No   Drug use: No   Sexual activity: Yes    Birth control/protection: Post-menopausal  Other Topics Concern   Not on file  Social History Narrative   Not on file   Social Determinants of Health   Financial Resource Strain: Not on file  Food Insecurity: Not on file  Transportation Needs: Not on file  Physical Activity: Not on file  Stress: Not on file  Social Connections: Not on file  Intimate Partner Violence: Not on file     Facility-Administered Medications Prior to Admission  Medication Dose Route Frequency Provider Last Rate Last Admin   0.9 %  sodium chloride infusion  500 mL Intravenous Continuous Lafayette Dragon, MD       0.9  %  sodium chloride infusion  500 mL Intravenous Continuous Nandigam, Kavitha V, MD       Medications Prior to Admission  Medication Sig Dispense Refill Last Dose   apixaban (ELIQUIS) 5 MG TABS tablet Take 5 mg by mouth 2 (two) times daily.   02/23/2021   famotidine (PEPCID) 40 MG tablet Take 1 tablet (40 mg total) by mouth at bedtime. (Patient taking differently: Take 20 mg by mouth daily.) 30 tablet 3 02/22/2021   furosemide (LASIX) 20 MG tablet Take 80 mg by mouth as needed for fluid.   Past Week   omeprazole (PRILOSEC) 40 MG capsule Take 1 capsule (40 mg total) by mouth daily. 90 capsule 3 02/23/2021   venlafaxine XR (EFFEXOR-XR) 150 MG 24 hr capsule Take 150 mg by mouth daily.   02/22/2021   linaclotide (LINZESS) 72 MCG capsule Take 1 capsule (72 mcg total) by mouth daily before breakfast. (Patient not taking: No sig reported) 30 capsule 3    omeprazole (PRILOSEC) 40 MG capsule Take 1 capsule (40 mg total) by mouth daily. (Patient not taking: Reported on 02/23/2021) 90 capsule 3 Not Taking    Review of Systems  Constitutional: Negative for decreased appetite, malaise/fatigue, weight gain and weight loss.  HENT:  Negative for congestion.   Eyes:  Negative for visual disturbance.  Cardiovascular:  Negative for chest pain, dyspnea on exertion, leg swelling, palpitations and syncope.  Respiratory:  Negative for cough.   Endocrine: Negative for cold intolerance.  Hematologic/Lymphatic: Does not bruise/bleed easily.  Skin:  Negative for itching and rash.  Musculoskeletal:  Negative for myalgias.  Gastrointestinal:  Negative for abdominal pain, nausea and vomiting.  Genitourinary:  Negative for dysuria.  Neurological:  Negative for dizziness and weakness.  Psychiatric/Behavioral:  The patient is not nervous/anxious.   All other systems reviewed and are negative.    Physical Exam: Physical Exam Vitals and nursing note reviewed.  Constitutional:      General: She is not in acute distress.     Appearance: She is well-developed.  HENT:     Head: Normocephalic and atraumatic.  Eyes:     Conjunctiva/sclera: Conjunctivae normal.     Pupils: Pupils are equal, round, and reactive to light.  Neck:     Vascular: No JVD.  Cardiovascular:     Rate and Rhythm: Normal rate and regular rhythm.     Pulses: Normal pulses and intact distal pulses.     Heart sounds: No murmur heard. Pulmonary:     Effort: Pulmonary effort  is normal.     Breath sounds: Normal breath sounds. No wheezing or rales.  Abdominal:     General: Bowel sounds are normal.     Palpations: Abdomen is soft.     Tenderness: There is no rebound.  Musculoskeletal:        General: No tenderness. Normal range of motion.     Right lower leg: No edema.     Left lower leg: No edema.  Lymphadenopathy:     Cervical: No cervical adenopathy.  Skin:    General: Skin is warm and dry.  Neurological:     Mental Status: She is alert and oriented to person, place, and time.     Cranial Nerves: No cranial nerve deficit.     Labs:   Lab Results  Component Value Date   WBC 10.4 02/25/2021   HGB 12.6 02/25/2021   HCT 39.6 02/25/2021   MCV 89.4 02/25/2021   PLT 239 02/25/2021    Recent Labs  Lab 02/23/21 1317 02/24/21 0217 02/25/21 0254  NA 137   < > 141  K 3.4*   < > 3.9  CL 99   < > 106  CO2 27   < > 29  BUN 19   < > 16  CREATININE 0.91   < > 0.84  CALCIUM 9.4   < > 9.2  PROT 7.6  --   --   BILITOT 0.7  --   --   ALKPHOS 81  --   --   ALT 26  --   --   AST 30  --   --   GLUCOSE 137*   < > 130*   < > = values in this interval not displayed.    Lipid Panel     Component Value Date/Time   CHOL 272 (H) 02/24/2021 0217   TRIG 124 02/24/2021 0217   HDL 67 02/24/2021 0217   CHOLHDL 4.1 02/24/2021 0217   VLDL 25 02/24/2021 0217   LDLCALC 180 (H) 02/24/2021 0217    BNP (last 3 results) Recent Labs    02/23/21 1317  BNP 11.4    HEMOGLOBIN A1C Lab Results  Component Value Date   HGBA1C 5.8 (H)  02/23/2021   MPG 119.76 02/23/2021    Cardiac Panel (last 3 results) No results for input(s): CKTOTAL, CKMB, RELINDX in the last 8760 hours.  Invalid input(s): TROPONINHS  No results found for: CKTOTAL, CKMB, CKMBINDEX   TSH Recent Labs    02/23/21 1954  TSH 0.177*      Radiology: DG Chest Portable 1 View  Result Date: 02/23/2021 CLINICAL DATA:  Chest pain and shortness of breath with history of atrial fibrillation. EXAM: PORTABLE CHEST 1 VIEW COMPARISON:  August 31, 2016 FINDINGS: EKG leads project over the chest. Device, electronic device of some sort projecting over the central chest over the cardiac silhouette, likely external to the patient. Trachea midline. Cardiomediastinal contours and hilar structures are normal. Lungs are clear. No sign of effusion on frontal radiograph. No pneumothorax. On limited assessment no acute skeletal process. IMPRESSION: No acute cardiopulmonary disease. Electronically Signed   By: Zetta Bills M.D.   On: 02/23/2021 14:31   ECHOCARDIOGRAM COMPLETE  Result Date: 02/24/2021    ECHOCARDIOGRAM REPORT   Patient Name:   Dorothy Mcdonald Date of Exam: 02/24/2021 Medical Rec #:  [id number]     Height:       65.0 in Accession #:    [id number]    Weight:  190.0 lb Date of Birth:  10-10-1945    BSA:          1.935 m Patient Age:    65 years      BP:           121/75 mmHg Patient Gender: F             HR:           75 bpm. Exam Location:  Inpatient Procedure: 2D Echo, Cardiac Doppler and Color Doppler Indications:    R07.9* Chest pain, unspecified  History:        Patient has prior history of Echocardiogram examinations, most                 recent 01/31/2016. Arrythmias:Atrial Fibrillation.  Sonographer:    Merrie Roof RDCS Referring Phys: [id number] [place]  1. Left ventricular ejection fraction, by estimation, is 60 to 65%. The left ventricle has normal function. The left ventricle has no regional wall motion abnormalities. There is mild left  ventricular hypertrophy. Left ventricular diastolic parameters were normal.  2. Right ventricular systolic function is normal. The right ventricular size is normal.  3. The mitral valve is normal in structure. Trivial mitral valve regurgitation. No evidence of mitral stenosis.  4. The aortic valve is tricuspid. Aortic valve regurgitation is trivial. Mild aortic valve sclerosis is present, with no evidence of aortic valve stenosis.  5. The inferior vena cava is normal in size with greater than 50% respiratory variability, suggesting right atrial pressure of 3 mmHg. FINDINGS  Left Ventricle: Left ventricular ejection fraction, by estimation, is 60 to 65%. The left ventricle has normal function. The left ventricle has no regional wall motion abnormalities. The left ventricular internal cavity size was normal in size. There is  mild left ventricular hypertrophy. Left ventricular diastolic parameters were normal. Right Ventricle: The right ventricular size is normal. No increase in right ventricular wall thickness. Right ventricular systolic function is normal. Left Atrium: Left atrial size was normal in size. Right Atrium: Right atrial size was normal in size. Pericardium: There is no evidence of pericardial effusion. Mitral Valve: The mitral valve is normal in structure. Trivial mitral valve regurgitation. No evidence of mitral valve stenosis. Tricuspid Valve: The tricuspid valve is normal in structure. Tricuspid valve regurgitation is trivial. Aortic Valve: The aortic valve is tricuspid. Aortic valve regurgitation is trivial. Mild aortic valve sclerosis is present, with no evidence of aortic valve stenosis. Aortic valve mean gradient measures 5.0 mmHg. Aortic valve peak gradient measures 9.4 mmHg. Aortic valve area, by VTI measures 1.69 cm. Pulmonic Valve: The pulmonic valve was not well visualized. Pulmonic valve regurgitation is not visualized. Aorta: The aortic root and ascending aorta are structurally normal,  with no evidence of dilitation. Venous: The inferior vena cava is normal in size with greater than 50% respiratory variability, suggesting right atrial pressure of 3 mmHg. IAS/Shunts: The interatrial septum was not well visualized.  LEFT VENTRICLE PLAX 2D LVIDd:         3.80 cm  Diastology LVIDs:         2.30 cm  LV e' medial:    9.57 cm/s LV PW:         1.10 cm  LV E/e' medial:  8.6 LV IVS:        1.20 cm  LV e' lateral:   11.30 cm/s LVOT diam:     1.80 cm  LV E/e' lateral: 7.3 LV SV:  56 LV SV Index:   29 LVOT Area:     2.54 cm  RIGHT VENTRICLE RV Basal diam:  3.50 cm LEFT ATRIUM           Index       RIGHT ATRIUM           Index LA diam:      3.40 cm 1.76 cm/m  RA Area:     14.70 cm LA Vol (A4C): 66.8 ml 34.51 ml/m RA Volume:   36.80 ml  19.01 ml/m  AORTIC VALVE AV Area (Vmax):    1.68 cm AV Area (Vmean):   1.64 cm AV Area (VTI):     1.69 cm AV Vmax:           153.00 cm/s AV Vmean:          110.000 cm/s AV VTI:            0.332 m AV Peak Grad:      9.4 mmHg AV Mean Grad:      5.0 mmHg LVOT Vmax:         101.00 cm/s LVOT Vmean:        70.700 cm/s LVOT VTI:          0.221 m LVOT/AV VTI ratio: 0.67  AORTA Ao Root diam: 3.70 cm Ao Asc diam:  3.20 cm MITRAL VALVE MV Area (PHT): 3.31 cm     SHUNTS MV Decel Time: 229 msec     Systemic VTI:  0.22 m MV E velocity: 82.30 cm/s   Systemic Diam: 1.80 cm MV A velocity: 112.00 cm/s MV E/A ratio:  0.73 Oswaldo Milian MD Electronically signed by Oswaldo Milian MD Signature Date/Time: 02/24/2021/1:42:17 PM    Final     Scheduled Meds:  apixaban  5 mg Oral BID   famotidine  20 mg Oral QHS   pantoprazole  40 mg Oral BID   sodium chloride flush  3 mL Intravenous Q12H   venlafaxine XR  150 mg Oral QHS   Continuous Infusions:  sodium chloride     PRN Meds:.sodium chloride, nitroGLYCERIN, sodium chloride flush, zolpidem  CARDIAC STUDIES:  EKG 02/23/2021: Sinus rhythm with sinus arrhythmia Diffuse nonspecific ST-T changes  Echocardiogram  02/24/2021:  1. Left ventricular ejection fraction, by estimation, is 60 to 65%. The  left ventricle has normal function. The left ventricle has no regional  wall motion abnormalities. There is mild left ventricular hypertrophy.  Left ventricular diastolic parameters  were normal.   2. Right ventricular systolic function is normal. The right ventricular  size is normal.   3. The mitral valve is normal in structure. Trivial mitral valve  regurgitation. No evidence of mitral stenosis.   4. The aortic valve is tricuspid. Aortic valve regurgitation is trivial.  Mild aortic valve sclerosis is present, with no evidence of aortic valve  stenosis.   5. The inferior vena cava is normal in size with greater than 50%  respiratory variability, suggesting right atrial pressure of 3 mmHg.     Assessment & Recommendations:  75 y.o. Caucasian female  with hyperlipidemia, GERD, paroxysmal atrial fibrillation, now admitted with chest pain.  Chest pain: ACS ruled out.  Symptoms concerning for stable angina.  I gave the patient options of outpatient work-up with her primary cardiologist Dr. Marvel Plan, versus inpatient work-up while here.  Patient is very afraid to go home without inpatient work-up due to her recurrent symptoms. Will proceed with Lexiscan nuclear stress test on 02/26/2021 inpatient.  Patient  unable to perform treadmill exercise due to knee pain. In the interim, I will go ahead and start her on metoprolol succinate 25 mg daily.  Mixed hyperlipidemia: Per daughter, cholesterol 270, LDL 170, when last check with PCP. Patient previously tried Lipitor but had myalgias. Will start Crestor 10 mg daily and check lipid panel.  PAF: CHA2DS2-VASc score 2, annual stroke risk 2.2% Continue Eliquis 5 mg twice daily    Nigel Mormon, MD Pager: [phone] Office: [phone]

## 2021-02-26 ENCOUNTER — Observation Stay (HOSPITAL_COMMUNITY): Payer: Medicare HMO

## 2021-02-26 DIAGNOSIS — F419 Anxiety disorder, unspecified: Secondary | ICD-10-CM | POA: Diagnosis not present

## 2021-02-26 DIAGNOSIS — K219 Gastro-esophageal reflux disease without esophagitis: Secondary | ICD-10-CM | POA: Diagnosis not present

## 2021-02-26 DIAGNOSIS — R002 Palpitations: Secondary | ICD-10-CM

## 2021-02-26 DIAGNOSIS — R079 Chest pain, unspecified: Secondary | ICD-10-CM

## 2021-02-26 LAB — LIPID PANEL
Cholesterol: 237 mg/dL — ABNORMAL HIGH (ref 0–200)
HDL: 59 mg/dL (ref >40)
LDL Cholesterol: 158 mg/dL — ABNORMAL HIGH (ref 0–99)
Total CHOL/HDL Ratio: 4 RATIO
Triglycerides: 100 mg/dL (ref <150)
VLDL: 20 mg/dL (ref 0–40)

## 2021-02-26 MED ORDER — REGADENOSON 0.4 MG/5ML IV SOLN
0.4000 mg | Freq: Once | INTRAVENOUS | Status: AC
  Filled 2021-02-26: qty 5

## 2021-02-26 MED ORDER — FAMOTIDINE 40 MG PO TABS: 20.0000 mg | ORAL_TABLET | Freq: Every day | ORAL | Status: DC

## 2021-02-26 MED ORDER — TECHNETIUM TC 99M TETROFOSMIN IV KIT
10.0000 | PACK | Freq: Once | INTRAVENOUS | Status: AC | PRN
  Administered 2021-02-26: 10 via INTRAVENOUS

## 2021-02-26 MED ORDER — TECHNETIUM TC 99M TETROFOSMIN IV KIT
30.0000 | PACK | Freq: Once | INTRAVENOUS | Status: AC | PRN
  Administered 2021-02-26: 30 via INTRAVENOUS

## 2021-02-26 MED ORDER — METOPROLOL SUCCINATE ER 25 MG PO TB24: 25.0000 mg | ORAL_TABLET | Freq: Every day | ORAL | 0 refills | Status: AC

## 2021-02-26 MED ORDER — REGADENOSON 0.4 MG/5ML IV SOLN
INTRAVENOUS | Status: AC
  Administered 2021-02-26: 0.4 mg via INTRAVENOUS
  Filled 2021-02-26: qty 5

## 2021-02-26 MED ORDER — ROSUVASTATIN CALCIUM 10 MG PO TABS: 10.0000 mg | ORAL_TABLET | Freq: Every day | ORAL | 0 refills | Status: AC

## 2021-02-26 MED ORDER — NITROGLYCERIN 0.4 MG SL SUBL: 0.4000 mg | SUBLINGUAL_TABLET | SUBLINGUAL | 1 refills | Status: DC | PRN

## 2021-02-26 NOTE — Progress Notes (Signed)
I was informed of technical issues with performing the stress test today. I do not know if and when the study could be completed. I spoke with patient and her husband and gave them the option of doing this outpatient. Given her symptoms, they still want to pursue and complete the workup while inpatient.   Nigel Mormon, MD Pager: [phone] Office: [phone]

## 2021-02-26 NOTE — Discharge Summary (Addendum)
DISCHARGE SUMMARY  Dorothy Mcdonald  MR#: [id number]  DOB:08-10-1946  Date of Admission: 02/23/2021 Date of Discharge: 02/26/2021  Attending Physician:Westen Dinino Hennie Duos, MD  Patient's DUK:GURKYH, Dorothy Jews, MD  Consults: Cardiology   Disposition: D/C home   Follow-up Appts:  Follow-up Information     Leonard Downing, MD. Schedule an appointment as soon as possible for a visit in 1 week(s).   Specialty: Family Medicine Contact information: [place] [address] [phone]                 Tests Needing Follow-up: recheck TSH in 6-8 weeks  Discharge Diagnoses: Chest pressure Exertional palpitations Chronic paroxysmal atrial fibrillation Abnormal TSH Hypokalemia HLD Generalized anxiety GERD Obesity - Body mass index is 31.62 kg/m.  Initial presentation: 75 year old with a history of paroxysmal atrial fibrillation, GERD, and anxiety who presented to the ED with chest pressure, dyspnea, and palpitations for 2 weeks.  At the time of her admission she was wearing an ambulatory heart rate monitor arranged by her PCP.  Hospital Course:  Chest pressure / exertional palpitations Cardiology consulted during admission - Lexiscan 6/27 w/o evidence of reversible ischemia or infarction w/ preserved EF - followed in outpatient setting by [facility] - to f/u with her usual Cardiologist post-d/c   Chronic paroxysmal atrial fibrillation In sinus rhythm th/o this hospital stay - no changes made in chronic medical tx    Abnormal TSH TSH low at 0.177 but free T4 normal - recheck in 6-8 weeks in outpt setting    Hypokalemia Corrected w/ simple supplementation   HLD LDL 180 this admission - reported intolerance to Lipitor - Crestor initiated by Cardiology this admission    Generalized anxiety Continue Effexor   GERD Continue home PPI   Obesity - Body mass index is 31.62 kg/m.    Allergies as of 02/26/2021       Reactions    Amoxicillin-pot Clavulanate Nausea And Vomiting   None listed per Brady.   Penicillins Nausea And Vomiting   Patient denies        Medication List     STOP taking these medications    linaclotide 72 MCG capsule Commonly known as: Linzess       TAKE these medications    apixaban 5 MG Tabs tablet Commonly known as: ELIQUIS Take 5 mg by mouth 2 (two) times daily.   famotidine 40 MG tablet Commonly known as: PEPCID Take 0.5 tablets (20 mg total) by mouth daily.   furosemide 20 MG tablet Commonly known as: LASIX Take 80 mg by mouth as needed for fluid.   metoprolol succinate 25 MG 24 hr tablet Commonly known as: TOPROL-XL Take 1 tablet (25 mg total) by mouth daily. Start taking on: February 27, 2021   omeprazole 40 MG capsule Commonly known as: PRILOSEC Take 1 capsule (40 mg total) by mouth daily. What changed: Another medication with the same name was removed. Continue taking this medication, and follow the directions you see here.   rosuvastatin 10 MG tablet Commonly known as: CRESTOR Take 1 tablet (10 mg total) by mouth daily. Start taking on: February 27, 2021   venlafaxine XR 150 MG 24 hr capsule Commonly known as: EFFEXOR-XR Take 150 mg by mouth daily.        Day of Discharge BP 138/68 (BP Location: Left Arm)   Pulse 63   Temp 97.7 F (36.5 C) (Oral)   Resp 17   Ht 5\' 5"  (1.651  m)   Wt 86.2 kg   SpO2 96%   BMI 31.62 kg/m   Physical Exam: General: No acute respiratory distress Lungs: Clear to auscultation bilaterally without wheezes or crackles Cardiovascular: Regular rate and rhythm without murmur gallop or rub normal S1 and S2 Abdomen: Nontender, nondistended, soft, bowel sounds positive, no rebound, no ascites, no appreciable mass Extremities: No significant cyanosis, clubbing, or edema bilateral lower extremities  Basic Metabolic Panel: Recent Labs  Lab 02/23/21 1317 02/24/21 0217 02/25/21 0254  NA 137 136 141  K  3.4* 3.9 3.9  CL 99 97* 106  CO2 27 27 29   GLUCOSE 137* 113* 130*  BUN 19 19 16   CREATININE 0.91 0.86 0.84  CALCIUM 9.4 9.3 9.2  MG  --   --  1.9    Liver Function Tests: Recent Labs  Lab 02/23/21 1317  AST 30  ALT 26  ALKPHOS 81  BILITOT 0.7  PROT 7.6  ALBUMIN 3.9    CBC: Recent Labs  Lab 02/23/21 1317 02/25/21 0254  WBC 11.1* 10.4  HGB 14.9 12.6  HCT 45.0 39.6  MCV 87.5 89.4  PLT 288 239    BNP (last 3 results) Recent Labs    02/23/21 1317  BNP 11.4     Recent Results (from the past 240 hour(s))  SARS CORONAVIRUS 2 (TAT 6-24 HRS) Nasopharyngeal Nasopharyngeal Swab     Status: None   Collection Time: 02/23/21  2:49 PM   Specimen: Nasopharyngeal Swab  Result Value Ref Range Status   SARS Coronavirus 2 NEGATIVE NEGATIVE Final    Comment: (NOTE) SARS-CoV-2 target nucleic acids are NOT DETECTED.  The SARS-CoV-2 RNA is generally detectable in upper and lower respiratory specimens during the acute phase of infection. Negative results do not preclude SARS-CoV-2 infection, do not rule out co-infections with other pathogens, and should not be used as the sole basis for treatment or other patient management decisions. Negative results must be combined with clinical observations, patient history, and epidemiological information. The expected result is Negative.  Fact Sheet for Patients: SugarRoll.be  Fact Sheet for Healthcare Providers: https://www.woods-mathews.com/  This test is not yet approved or cleared by the [place] FDA and  has been authorized for detection and/or diagnosis of SARS-CoV-2 by FDA under an Emergency Use Authorization (EUA). This EUA will remain  in effect (meaning this test can be used) for the duration of the COVID-19 declaration under Se ction 564(b)(1) of the Act, 21 U.S.C. section 360bbb-3(b)(1), unless the authorization is terminated or revoked sooner.  Performed at [facility], [place] [address] [place], [place] [address]       Time spent in discharge (includes decision making & examination of pt): 35 minutes  02/26/2021, 6:35 PM   Cherene Altes, MD [facility] Office  [phone]

## 2021-02-26 NOTE — Progress Notes (Signed)
Pt ambulated 100 ft with keeping her HR between 83-95 bpm and 96% on RA

## 2021-02-26 NOTE — Progress Notes (Addendum)
I called patient's room. Patient's daughter answered the phone and expressed that she was upset that the results not discussed with them. I explained the results. Given her symptoms of rate related angina symptoms, it is still reasonable to treat with metoprolol succinate 25 mg daily, along with statin and SL NTG. I do not think Aspirin is indicated. Patient can continue outpatient follow up with her cardiologist Dr. Marvel Plan. I will send the prescriptions for discharge.    Nigel Mormon, MD Pager: [phone] Office: [phone]

## 2021-03-13 ENCOUNTER — Ambulatory Visit: Payer: Medicare HMO | Admitting: Gastroenterology

## 2021-03-23 ENCOUNTER — Other Ambulatory Visit: Payer: Self-pay | Admitting: Gastroenterology

## 2021-10-17 DIAGNOSIS — G8918 Other acute postprocedural pain: Secondary | ICD-10-CM | POA: Diagnosis not present

## 2021-10-17 DIAGNOSIS — M94262 Chondromalacia, left knee: Secondary | ICD-10-CM | POA: Diagnosis not present

## 2021-10-17 DIAGNOSIS — S83272A Complex tear of lateral meniscus, current injury, left knee, initial encounter: Secondary | ICD-10-CM | POA: Diagnosis not present

## 2021-10-17 DIAGNOSIS — M65862 Other synovitis and tenosynovitis, left lower leg: Secondary | ICD-10-CM | POA: Diagnosis not present

## 2021-10-17 DIAGNOSIS — M659 Synovitis and tenosynovitis, unspecified: Secondary | ICD-10-CM | POA: Diagnosis not present

## 2021-10-17 DIAGNOSIS — S83232A Complex tear of medial meniscus, current injury, left knee, initial encounter: Secondary | ICD-10-CM | POA: Diagnosis not present

## 2021-10-24 DIAGNOSIS — M25562 Pain in left knee: Secondary | ICD-10-CM | POA: Diagnosis not present

## 2021-10-24 DIAGNOSIS — Z4789 Encounter for other orthopedic aftercare: Secondary | ICD-10-CM | POA: Diagnosis not present

## 2021-11-08 DIAGNOSIS — Z4789 Encounter for other orthopedic aftercare: Secondary | ICD-10-CM | POA: Diagnosis not present

## 2021-11-19 DIAGNOSIS — R7301 Impaired fasting glucose: Secondary | ICD-10-CM | POA: Diagnosis not present

## 2021-11-19 DIAGNOSIS — I4891 Unspecified atrial fibrillation: Secondary | ICD-10-CM | POA: Diagnosis not present

## 2021-11-19 DIAGNOSIS — I10 Essential (primary) hypertension: Secondary | ICD-10-CM | POA: Diagnosis not present

## 2021-11-19 DIAGNOSIS — F419 Anxiety disorder, unspecified: Secondary | ICD-10-CM | POA: Diagnosis not present

## 2021-11-19 DIAGNOSIS — Z0000 Encounter for general adult medical examination without abnormal findings: Secondary | ICD-10-CM | POA: Diagnosis not present

## 2021-12-31 ENCOUNTER — Encounter: Payer: Self-pay | Admitting: Physician Assistant

## 2021-12-31 ENCOUNTER — Emergency Department (HOSPITAL_COMMUNITY): Payer: Medicare Other

## 2021-12-31 ENCOUNTER — Encounter (HOSPITAL_COMMUNITY): Payer: Self-pay

## 2021-12-31 ENCOUNTER — Emergency Department (HOSPITAL_COMMUNITY)
Admission: EM | Admit: 2021-12-31 | Discharge: 2021-12-31 | Discharge status: Against Medical Advice | Payer: Medicare Other | Attending: Emergency Medicine | Admitting: Emergency Medicine

## 2021-12-31 ENCOUNTER — Ambulatory Visit: Payer: Medicare Other | Admitting: Physician Assistant

## 2021-12-31 VITALS — BP 118/76 | HR 98 | Ht 65.0 in | Wt 199.0 lb

## 2021-12-31 DIAGNOSIS — R1013 Epigastric pain: Secondary | ICD-10-CM | POA: Diagnosis not present

## 2021-12-31 DIAGNOSIS — R110 Nausea: Secondary | ICD-10-CM | POA: Diagnosis not present

## 2021-12-31 DIAGNOSIS — Z5321 Procedure and treatment not carried out due to patient leaving prior to being seen by health care provider: Secondary | ICD-10-CM | POA: Insufficient documentation

## 2021-12-31 DIAGNOSIS — Z8719 Personal history of other diseases of the digestive system: Secondary | ICD-10-CM | POA: Diagnosis not present

## 2021-12-31 DIAGNOSIS — W01198A Fall on same level from slipping, tripping and stumbling with subsequent striking against other object, initial encounter: Secondary | ICD-10-CM | POA: Diagnosis not present

## 2021-12-31 DIAGNOSIS — R109 Unspecified abdominal pain: Secondary | ICD-10-CM | POA: Diagnosis not present

## 2021-12-31 DIAGNOSIS — R42 Dizziness and giddiness: Secondary | ICD-10-CM | POA: Diagnosis not present

## 2021-12-31 DIAGNOSIS — R519 Headache, unspecified: Secondary | ICD-10-CM | POA: Diagnosis not present

## 2021-12-31 DIAGNOSIS — I700 Atherosclerosis of aorta: Secondary | ICD-10-CM | POA: Diagnosis not present

## 2021-12-31 DIAGNOSIS — R1010 Upper abdominal pain, unspecified: Secondary | ICD-10-CM | POA: Diagnosis not present

## 2021-12-31 DIAGNOSIS — R112 Nausea with vomiting, unspecified: Secondary | ICD-10-CM | POA: Insufficient documentation

## 2021-12-31 DIAGNOSIS — S0993XA Unspecified injury of face, initial encounter: Secondary | ICD-10-CM | POA: Diagnosis not present

## 2021-12-31 LAB — COMPREHENSIVE METABOLIC PANEL
ALT: 49 U/L — ABNORMAL HIGH (ref 0–44)
AST: 64 U/L — ABNORMAL HIGH (ref 15–41)
Albumin: 3.9 g/dL (ref 3.5–5.0)
Alkaline Phosphatase: 78 U/L (ref 38–126)
Anion gap: 11 (ref 5–15)
BUN: 19 mg/dL (ref 8–23)
CO2: 19 mmol/L — ABNORMAL LOW (ref 22–32)
Calcium: 8.5 mg/dL — ABNORMAL LOW (ref 8.9–10.3)
Chloride: 103 mmol/L (ref 98–111)
Creatinine, Ser: 1.16 mg/dL — ABNORMAL HIGH (ref 0.44–1.00)
GFR, Estimated: 50 mL/min — ABNORMAL LOW (ref >60)
Glucose, Bld: 169 mg/dL — ABNORMAL HIGH (ref 70–99)
Potassium: 3.6 mmol/L (ref 3.5–5.1)
Sodium: 133 mmol/L — ABNORMAL LOW (ref 135–145)
Total Bilirubin: 0.4 mg/dL (ref 0.3–1.2)
Total Protein: 7.4 g/dL (ref 6.5–8.1)

## 2021-12-31 LAB — LIPASE, BLOOD: Lipase: 38 U/L (ref 11–51)

## 2021-12-31 LAB — TROPONIN I (HIGH SENSITIVITY): Troponin I (High Sensitivity): 6 ng/L (ref <18)

## 2021-12-31 LAB — CBC
HCT: 42.7 % (ref 36.0–46.0)
Hemoglobin: 14.5 g/dL (ref 12.0–15.0)
MCH: 29.3 pg (ref 26.0–34.0)
MCHC: 34 g/dL (ref 30.0–36.0)
MCV: 86.3 fL (ref 80.0–100.0)
Platelets: 212 10*3/uL (ref 150–400)
RBC: 4.95 MIL/uL (ref 3.87–5.11)
RDW: 13.2 % (ref 11.5–15.5)
WBC: 7.3 10*3/uL (ref 4.0–10.5)
nRBC: 0 % (ref 0.0–0.2)

## 2021-12-31 MED ORDER — SODIUM CHLORIDE (PF) 0.9 % IJ SOLN
INTRAMUSCULAR | Status: DC
Start: 2021-12-31 — End: 2021-12-31
  Filled 2021-12-31: qty 50

## 2021-12-31 MED ORDER — IOHEXOL 300 MG/ML  SOLN
100.0000 mL | Freq: Once | INTRAMUSCULAR | Status: AC | PRN
  Administered 2021-12-31: 100 mL via INTRAVENOUS

## 2021-12-31 MED ORDER — ONDANSETRON HCL 4 MG PO TABS: 4.0000 mg | ORAL_TABLET | Freq: Three times a day (TID) | ORAL | 1 refills | Status: DC | PRN

## 2021-12-31 MED ORDER — OMEPRAZOLE 40 MG PO CPDR: 40.0000 mg | DELAYED_RELEASE_CAPSULE | Freq: Two times a day (BID) | ORAL | 3 refills | Status: DC

## 2021-12-31 NOTE — Patient Instructions (Signed)
If you are age 74 or older, your body mass index should be between 23-30. Your Body mass index is 33.12 kg/m?Marland Kitchen If this is out of the aforementioned range listed, please consider follow up with your Primary Care Provider. ?________________________________________________________ ? ?The [facility] providers would like to encourage you to use [facility] to communicate with providers for non-urgent requests or questions.  Due to long hold times on the telephone, sending your provider a message by [facility] may be a faster and more efficient way to get a response.  Please allow 48 business hours for a response.  Please remember that this is for non-urgent requests.  ?_______________________________________________________ ? ?Increase Omperazole 40 mg to twice daily before meals ? ?START Ondansetron 4 mg 1 tablet every 6-8 hours as needed. ? ?Proceed to the ER. ? ?Thank you for entrusting me with your care and choosing [facility]. ? ?Nicoletta Ba, AP-C ?

## 2021-12-31 NOTE — ED Notes (Signed)
This Probation officer removed pt's IV and pt and daughter verbally stated they were leaving.  ?

## 2021-12-31 NOTE — Progress Notes (Signed)
? ?Subjective:  ? ? Patient ID: Dorothy Mcdonald, female    DOB: 24-Dec-1945, 76 y.o.   MRN: [id number] ? ?HPI ?Dorothy Mcdonald is a pleasant 76 year old white female, established with Dr. Silverio Decamp who was last seen in our office in July 2021.  She has history of atrial fibrillation for which she is on Eliquis, GERD, chronic constipation/IBS, dyssynergic defecation, anxiety, she is status post cholecystectomy, and has family history of colon cancer. ?She had undergone EGD in March 2021 that showed a hiatal hernia, diffuse gastritis, with no evidence of H. pylori, colonoscopy at that same time with removal of 3 sessile polyps which were tubular adenomas, noted sigmoid diverticulosis and internal hemorrhoids. ? ?She comes in today with complaints of fairly constant epigastric pain which has been present over the past month and worse over the past week.  She has not had any radiation to her back or chest, she describes it as a gnawing type pain which can be fairly intense at times.  This has been associated with nausea but no vomiting.  Appetite has been decreased somewhat, she thinks at times she may feel little bit better with some food on her stomach but is not sure. ?She also relates that she has been having some frequent dizzy spells, and had 1 today and actually fell at home.  She says she has been having these off and on recently, does not have history of vertigo or inner ear issues. ?She was started on metformin about 3 weeks ago and has had some loose stools with that.  She says her other GI symptoms were present prior to starting the metformin. ?She mentions that she had hospitalization about a year ago and had complete cardiac work-up which was negative but has had problems with exertional dyspnea over the past year or so as well. ? ? ?Vital signs were stable in the office today, blood pressure 118/76 pulse 98. ?She became quite dizzy going from lying to sitting position.  After she was examined, she then developed  vomiting. ? ?Review of Systems. Pertinent positive and negative review of systems were noted in the above HPI section.  All other review of systems was otherwise negative.  ? ?Outpatient Encounter Medications as of 12/31/2021  ?Medication Sig  ? apixaban (ELIQUIS) 5 MG TABS tablet Take 5 mg by mouth 2 (two) times daily.  ? furosemide (LASIX) 20 MG tablet Take 80 mg by mouth as needed for fluid.  ? lisinopril (ZESTRIL) 10 MG tablet Take 5-10 mg by mouth daily.  ? metFORMIN (GLUCOPHAGE) 1000 MG tablet Take by mouth.  ? metoprolol succinate (TOPROL-XL) 25 MG 24 hr tablet Take 1 tablet (25 mg total) by mouth daily.  ? nitroGLYCERIN (NITROSTAT) 0.4 MG SL tablet Place 1 tablet (0.4 mg total) under the tongue every 5 (five) minutes as needed for chest pain.  ? ondansetron (ZOFRAN) 4 MG tablet Take 1 tablet (4 mg total) by mouth every 8 (eight) hours as needed for nausea or vomiting.  ? rosuvastatin (CRESTOR) 10 MG tablet Take 1 tablet (10 mg total) by mouth daily.  ? venlafaxine XR (EFFEXOR-XR) 150 MG 24 hr capsule Take 150 mg by mouth daily.  ? [DISCONTINUED] omeprazole (PRILOSEC) 40 MG capsule TAKE 1 CAPSULE BY MOUTH EVERY DAY  ? omeprazole (PRILOSEC) 40 MG capsule Take 1 capsule (40 mg total) by mouth 2 (two) times daily before lunch and supper.  ? [DISCONTINUED] famotidine (PEPCID) 40 MG tablet Take 0.5 tablets (20 mg total) by mouth daily.  ? ?  No facility-administered encounter medications on file as of 12/31/2021.  ? ?Allergies  ?Allergen Reactions  ? Amoxicillin-Pot Clavulanate Nausea And Vomiting  ?  None listed per [place].  ? Penicillins Nausea And Vomiting  ?  Patient denies  ? ?Patient Active Problem List  ? Diagnosis Date Noted  ? Anxiety 02/23/2021  ? Palpitations 02/23/2021  ? Gastroesophageal reflux disease without esophagitis 12/24/2018  ? Paroxysmal atrial fibrillation ([place]) 03/24/2017  ? Constipation due to outlet dysfunction   ? ?Social History  ? ?Socioeconomic History  ? Marital  status: Married  ?  Spouse name: Not on file  ? Number of children: 4  ? Years of education: Not on file  ? Highest education level: Not on file  ?Occupational History  ? Occupation: retired  ?Tobacco Use  ? Smoking status: Former  ?  Types: Cigarettes  ?  Quit date: 09/02/1984  ?  Years since quitting: 37.3  ? Smokeless tobacco: Never  ?Vaping Use  ? Vaping Use: Never used  ?Substance and Sexual Activity  ? Alcohol use: No  ? Drug use: No  ? Sexual activity: Yes  ?  Birth control/protection: Post-menopausal  ?Other Topics Concern  ? Not on file  ?Social History Narrative  ? Not on file  ? ?Social Determinants of Health  ? ?Financial Resource Strain: Not on file  ?Food Insecurity: Not on file  ?Transportation Needs: Not on file  ?Physical Activity: Not on file  ?Stress: Not on file  ?Social Connections: Not on file  ?Intimate Partner Violence: Not on file  ? ? ?Ms. Rapaport's family history includes Breast cancer in her sister; Colon cancer in her father, paternal aunt, and paternal uncle; Ovarian cancer in her mother; Uterine cancer in her mother. ? ? ?   ?Objective:  ?  ?Vitals:  ? 12/31/21 1054  ?BP: 118/76  ?Pulse: 98  ?SpO2: 99%  ? ? ?Physical Exam Well-developed  elderly white female in no acute distress.  Accompanied by her daughter  , Weight, 199 BMI 33.1 ? ?HEENT; nontraumatic normocephalic, EOMI, PE R LA, sclera anicteric. ?Oropharynx; not examined today ?Neck; supple, no JVD ?Cardiovascular; regular rate and rhythm with U2-V2, soft systolic murmur ?Pulmonary; Clear bilaterally ?Abdomen; soft, he is tender in the epigastrium and left upper quadrant no rebound, nondistended, no palpable mass or hepatosplenomegaly, bowel sounds are active ?Rectal; not done today ?Skin; benign exam, no jaundice rash or appreciable lesions ?Extremities; bilateral feet are bluish, trace edema, warm to touch ?Neuro/Psych; alert and oriented x4, grossly nonfocal mood and affect appropriate  ? ? ? ?   ?Assessment & Plan:  ? ?#74  76 year old white female with 1 month history of epigastric pain, somewhat progressive and associated with nausea and decrease in appetite.  She had not had any vomiting until today in the office. ?She is also dizzy today, and was dizzy at home this morning and actually fell, she denies syncope. ?She became quite dizzy with change in position while on exam table today, no history of vertigo or inner ear issues.  Relates having some intermittent dizziness over the past few days. ? ?Etiology of her symptoms is not clear, I am concerned about gastropathy, versus peptic ulcer disease, consider pancreatitis, rule out neoplasm.  She is status post cholecystectomy. ?Uncertain if the dizziness is a separate issue or associated with above. ? ?#2 atrial fibrillation-on Eliquis ?3.  Chronic GERD on PPI ?4.  IBS/chronic constipation/dyssynergic defecation. ?5.  Diverticulosis ?6.  History of adenomatous  colon polyps-due for follow-up colonoscopy March 2024 ?#7 anxiety ? ?Plan; plan was to get CBC with differential, c-Met, sed rate, lipase, and schedule for CT of the abdomen and pelvis with contrast. ? ?However with repeated dizzy spells and now vomiting in the office, decision made to send her to the emergency room for further evaluation.  Hopefully above labs and CT can be done there. ? ?We will send prescription for Zofran 4 mg every 6 hours as needed for nausea, and had plan to increase omeprazole to 40 mg p.o. twice daily and short-term until  further work-up completed ? ? ?Timica Marcom S Alycea Segoviano PA-C ?12/31/2021 ? ? ?Cc: Leonard Downing, * ?  ?

## 2021-12-31 NOTE — ED Triage Notes (Signed)
Pt presents with c/o nausea and dizziness. Pt reports that she has had these symptoms on and off for approx one month and then today, she got dizzy upon standing and fell, hitting her face on the floor. Pt denies any LOC.  ?

## 2021-12-31 NOTE — ED Provider Triage Note (Signed)
Emergency Medicine Provider Triage Evaluation Note ? ?Dorothy Mcdonald , a 76 y.o. female  was evaluated in triage.  Pt complains of dizziness, nausea and upper abdominal pain.  Patient was being seen at [facility] for the symptoms today and was sent over to the ED for further evaluation.  Patient reports she has been experiencing intermittent dizziness described as room spinning associated often with nausea and frequent upper abdominal pain.  She reports sometimes symptoms are worse with food but other times come on on their own.  They were going to send her for an outpatient CT scan but then she had worsening pain and an episode of vomiting in the office, emesis was nonbloody.  Patient also reports that earlier this morning she became very dizzy and fell striking her face and has some tenderness over the left cheek.  Does not think she completely lost consciousness. ? ?Review of Systems  ?Positive: Dizziness, nausea, upper abdominal pain, facial pain ?Negative: Syncope, fever, hematemesis, blood in stool, chest pain ? ?Physical Exam  ?BP 105/68 (BP Location: Left Arm)   Pulse 92   Temp 97.7 ?F (36.5 ?C) (Oral)   Resp 18   SpO2 99%  ?Gen:   Awake, no distress   ?Resp:  Normal effort, RRR ?MSK:   Moves extremities without difficulty  ?Other:  Tenderness in the epigastric and left upper quadrants.  There is some tenderness over the left cheek with slight swelling noted. ? ?Medical Decision Making  ?Medically screening exam initiated at 12:26 PM.  Appropriate orders placed.  Idora Brosious was informed that the remainder of the evaluation will be completed by another provider, this initial triage assessment does not replace that evaluation, and the importance of remaining in the ED until their evaluation is complete. ? ? ?  ?Jacqlyn Larsen, PA-C ?12/31/21 1240 ? ?

## 2022-01-02 ENCOUNTER — Telehealth: Payer: Self-pay | Admitting: Physician Assistant

## 2022-01-02 NOTE — Telephone Encounter (Signed)
Spoke with the daughter Ms Joneen Caraway. The patient has not started her medications yet. She is picking them up today. No vomiting today. Continues to have diarrhea. She "ate a little this morning." Patient tells her she "just doesn't feel good."  ?At the ER she was found to have very low blood pressure. She is following up with her PCP tomorrow at 10:00 am. She would like to decide then about the next steps.  ?For today she will 1)start the PPI BID and Zofran PRN 2)encourage fluids and bland foods as tolerated 3)rest ?Is this okay? ? ?

## 2022-01-02 NOTE — Telephone Encounter (Signed)
Everything that was ordered was done under another provider's name. The results are available. ?

## 2022-01-02 NOTE — Telephone Encounter (Signed)
Patient daughter called requesting to speak to nurse. Per daughter, patient had CT scan and lab work done 12/31/21. Would like for Amy Esterwood to review results. Does patient need an EGD? Please advise. ?

## 2022-01-03 ENCOUNTER — Other Ambulatory Visit: Payer: Self-pay

## 2022-01-03 DIAGNOSIS — K529 Noninfective gastroenteritis and colitis, unspecified: Secondary | ICD-10-CM | POA: Diagnosis not present

## 2022-01-03 DIAGNOSIS — R55 Syncope and collapse: Secondary | ICD-10-CM | POA: Diagnosis not present

## 2022-01-03 DIAGNOSIS — N390 Urinary tract infection, site not specified: Secondary | ICD-10-CM | POA: Diagnosis not present

## 2022-01-03 DIAGNOSIS — R1013 Epigastric pain: Secondary | ICD-10-CM

## 2022-01-03 DIAGNOSIS — R232 Flushing: Secondary | ICD-10-CM | POA: Diagnosis not present

## 2022-01-03 DIAGNOSIS — R112 Nausea with vomiting, unspecified: Secondary | ICD-10-CM

## 2022-01-03 NOTE — Telephone Encounter (Addendum)
And culture plus additional blood work done. No results yet. PCP wants the EGD done. Patient's symptoms are unimproved at this time. ?Okay to schedule in the [facility]? ?I am sending the note for clearance to hold her Eliquis to cardiologist at [facility]. ?

## 2022-01-04 NOTE — Telephone Encounter (Signed)
Called the Cardiologist office, Dr Marvel Plan. Spoke with the nurse. The cardiac clearance request has not been scanned in yet, meaning she cannot verify it has been received. Confirmed fax number to be sent to as [phone]. Marked as urgent.  ?Spoke with the daughter of the patient and advised if we do not get a clearance to begin holding the Eliquis by 01/07/22, the procedure must be rescheduled. Instructed not to make the decision to hold the medication on their own. ?Daughter expresses understanding. ?

## 2022-01-04 NOTE — Telephone Encounter (Signed)
Dr Marcha Solders has cleared the patient to hold her Eliquis for 3 days prior to the procedure. ?Left details on the voicemail of the patient's daughter. ?Called the patient and spoke with her. Advised she will take her last dose of Eliquis on Sunday. She will not take it again until Dr Silverio Decamp tells her when to restart it. Patient will hold the Eliquis on 5/8, 5/9, and 5/10. ?

## 2022-01-04 NOTE — Telephone Encounter (Signed)
Ok, thanks.

## 2022-01-08 DIAGNOSIS — R300 Dysuria: Secondary | ICD-10-CM | POA: Diagnosis not present

## 2022-01-08 DIAGNOSIS — R3915 Urgency of urination: Secondary | ICD-10-CM | POA: Diagnosis not present

## 2022-01-08 DIAGNOSIS — R609 Edema, unspecified: Secondary | ICD-10-CM | POA: Diagnosis not present

## 2022-01-08 DIAGNOSIS — R531 Weakness: Secondary | ICD-10-CM | POA: Diagnosis not present

## 2022-01-09 ENCOUNTER — Ambulatory Visit (AMBULATORY_SURGERY_CENTER): Payer: Medicare Other | Admitting: Gastroenterology

## 2022-01-09 ENCOUNTER — Encounter: Payer: Self-pay | Admitting: Gastroenterology

## 2022-01-09 VITALS — BP 149/70 | HR 62 | Temp 97.8°F | Resp 12 | Ht 65.0 in | Wt 199.0 lb

## 2022-01-09 DIAGNOSIS — R112 Nausea with vomiting, unspecified: Secondary | ICD-10-CM | POA: Diagnosis not present

## 2022-01-09 DIAGNOSIS — K2970 Gastritis, unspecified, without bleeding: Secondary | ICD-10-CM

## 2022-01-09 DIAGNOSIS — K449 Diaphragmatic hernia without obstruction or gangrene: Secondary | ICD-10-CM | POA: Diagnosis not present

## 2022-01-09 DIAGNOSIS — K2100 Gastro-esophageal reflux disease with esophagitis, without bleeding: Secondary | ICD-10-CM | POA: Diagnosis not present

## 2022-01-09 DIAGNOSIS — R1013 Epigastric pain: Secondary | ICD-10-CM | POA: Diagnosis not present

## 2022-01-09 DIAGNOSIS — K219 Gastro-esophageal reflux disease without esophagitis: Secondary | ICD-10-CM | POA: Diagnosis not present

## 2022-01-09 MED ORDER — SODIUM CHLORIDE 0.9 % IV SOLN: 500.0000 mL | Freq: Once | INTRAVENOUS | Status: DC

## 2022-01-09 NOTE — Patient Instructions (Signed)
YOU HAD AN ENDOSCOPIC PROCEDURE TODAY AT THE [facility]:   Refer to the procedure report that was given to you for any specific questions about what was found during the examination.  If the procedure report does not answer your questions, please call your gastroenterologist to clarify.  If you requested that your care partner not be given the details of your procedure findings, then the procedure report has been included in a sealed envelope for you to review at your convenience later.  YOU SHOULD EXPECT: Some feelings of bloating in the abdomen. Passage of more gas than usual.  Walking can help get rid of the air that was put into your GI tract during the procedure and reduce the bloating. If you had a lower endoscopy (such as a colonoscopy or flexible sigmoidoscopy) you may notice spotting of blood in your stool or on the toilet paper. If you underwent a bowel prep for your procedure, you may not have a normal bowel movement for a few days.  Please Note:  You might notice some irritation and congestion in your nose or some drainage.  This is from the oxygen used during your procedure.  There is no need for concern and it should clear up in a day or so.  SYMPTOMS TO REPORT IMMEDIATELY:   Following lower endoscopy (colonoscopy or flexible sigmoidoscopy):  Excessive amounts of blood in the stool  Significant tenderness or worsening of abdominal pains  Swelling of the abdomen that is new, acute  Fever of 100F or higher   Following upper endoscopy (EGD)  Vomiting of blood or coffee ground material  New chest pain or pain under the shoulder blades  Painful or persistently difficult swallowing  New shortness of breath  Fever of 100F or higher  Black, tarry-looking stools  For urgent or emergent issues, a gastroenterologist can be reached at any hour by calling [phone]. Do not use MyChart messaging for urgent concerns.    DIET:  We do recommend a small meal at first, but  then you may proceed to your regular diet.  Drink plenty of fluids but you should avoid alcoholic beverages for 24 hours.  ACTIVITY:  You should plan to take it easy for the rest of today and you should NOT DRIVE or use heavy machinery until tomorrow (because of the sedation medicines used during the test).    FOLLOW UP: Our staff will call the number listed on your records 48-72 hours following your procedure to check on you and address any questions or concerns that you may have regarding the information given to you following your procedure. If we do not reach you, we will leave a message.  We will attempt to reach you two times.  During this call, we will ask if you have developed any symptoms of COVID 19. If you develop any symptoms (ie: fever, flu-like symptoms, shortness of breath, cough etc.) before then, please call [phone].  If you test positive for Covid 19 in the 2 weeks post procedure, please call and report this information to us.    If any biopsies were taken you will be contacted by phone or by letter within the next 1-3 weeks.  Please call us at [phone] if you have not heard about the biopsies in 3 weeks.    SIGNATURES/CONFIDENTIALITY: You and/or your care partner have signed paperwork which will be entered into your electronic medical record.  These signatures attest to the fact that that the information above on   your After Visit Summary has been reviewed and is understood.  Full responsibility of the confidentiality of this discharge information lies with you and/or your care-partner. 

## 2022-01-09 NOTE — Op Note (Signed)
[facility] ?Patient Name: Dorothy Mcdonald ?Procedure Date: 01/09/2022 1:59 PM ?MRN: [id number] ?Endoscopist: Mauri Pole , MD ?Age: 76 ?Referring MD:  ?Date of Birth: [birth year] ?Gender: Female ?Account #: [id number] ?Procedure:                Upper GI endoscopy ?Indications:              Epigastric abdominal pain, Esophageal reflux  ?                          symptoms that persist despite appropriate therapy ?Medicines:                Monitored Anesthesia Care ?Procedure:                Pre-Anesthesia Assessment: ?                          - Prior to the procedure, a History and Physical  ?                          was performed, and patient medications and  ?                          allergies were reviewed. The patient's tolerance of  ?                          previous anesthesia was also reviewed. The risks  ?                          and benefits of the procedure and the sedation  ?                          options and risks were discussed with the patient.  ?                          All questions were answered, and informed consent  ?                          was obtained. Prior Anticoagulants: The patient has  ?                          taken no previous anticoagulant or antiplatelet  ?                          agents. ASA Grade Assessment: II - A patient with  ?                          mild systemic disease. After reviewing the risks  ?                          and benefits, the patient was deemed in  ?                          satisfactory condition to undergo the procedure. ?  After obtaining informed consent, the endoscope was  ?                          passed under direct vision. Throughout the  ?                          procedure, the patient's blood pressure, pulse, and  ?                          oxygen saturations were monitored continuously. The  ?                          GIF [id number] #[id number] was introduced through the  ?                          mouth,  and advanced to the second part of duodenum.  ?                          The upper GI endoscopy was accomplished without  ?                          difficulty. The patient tolerated the procedure  ?                          well. ?Scope In: ?Scope Out: ?Findings:                 LA Grade B (one or more mucosal breaks greater than  ?                          5 mm, not extending between the tops of two mucosal  ?                          folds) esophagitis with no bleeding was found 34 to  ?                          35 cm from the incisors. ?                          A 5 cm hiatal hernia was present. ?                          Patchy mild inflammation characterized by erythema  ?                          was found in the entire examined stomach. Biopsies  ?                          were taken with a cold forceps for Helicobacter  ?                          pylori testing. ?                          The  examined duodenum was normal. ?                          The cardia and gastric fundus were normal on  ?                          retroflexion. ?Complications:            No immediate complications. ?Estimated Blood Loss:     Estimated blood loss was minimal. ?Impression:               - LA Grade B reflux esophagitis with no bleeding. ?                          - 5 cm hiatal hernia. ?                          - Gastritis. Biopsied. ?                          - Normal examined duodenum. ?Recommendation:           - Patient has a contact number available for  ?                          emergencies. The signs and symptoms of potential  ?                          delayed complications were discussed with the  ?                          patient. Return to normal activities tomorrow.  ?                          Written discharge instructions were provided to the  ?                          patient. ?                          - Resume previous diet. ?                          - Continue present medications. ?                           - Await pathology results. ?                          - Follow an antireflux regimen. ?                          - Return to GI office at appointment to be  ?                          scheduled in 2-3 months with APP or Dr.Ivanka Kirshner. ?Mauri Pole, MD ?01/09/2022 2:21:03 PM ?This report has been signed electronically. ?

## 2022-01-09 NOTE — Progress Notes (Signed)
Called to room to assist during endoscopic procedure.  Patient ID and intended procedure confirmed with present staff. Received instructions for my participation in the procedure from the performing physician.  

## 2022-01-09 NOTE — Progress Notes (Signed)
VS completed by AS.  Pt's states no medical or surgical changes since previsit or office visit.  

## 2022-01-09 NOTE — Progress Notes (Signed)
Please refer to office visit note 12/31/21 by Nicoletta Ba. No additional changes in H&P ?Patient is appropriate for planned procedure(s) and anesthesia in an ambulatory setting ? ?K. Denzil Magnuson , MD ?[phone]   ?

## 2022-01-09 NOTE — Progress Notes (Signed)
Report to PACU, RN, vss, BBS= Clear.  

## 2022-01-11 ENCOUNTER — Telehealth: Payer: Self-pay

## 2022-01-11 NOTE — Telephone Encounter (Signed)
?  Follow up Call- ? ? ?  01/09/2022  ?  1:28 PM 11/16/2019  ?  7:11 AM  ?Call back number  ?Post procedure Call Back phone  # [phone]  ?Permission to leave phone message Yes Yes  ?  ? ?Patient questions: ? ?Do you have a fever, pain , or abdominal swelling? No. ?Pain Score  0 * ? ?Have you tolerated food without any problems? Yes.   ? ?Have you been able to return to your normal activities? Yes.   ? ?Do you have any questions about your discharge instructions: ?Diet   No. ?Medications  No. ?Follow up visit  No. ? ?Do you have questions or concerns about your Care? No. ? ?Actions: ?* If pain score is 4 or above: ?No action needed, pain <4. ? ?Pt states she has resumed her eliquis. ? ? ?

## 2022-01-18 ENCOUNTER — Encounter: Payer: Medicare Other | Admitting: Gastroenterology

## 2022-01-22 ENCOUNTER — Encounter: Payer: Self-pay | Admitting: Gastroenterology

## 2022-01-23 DIAGNOSIS — M1711 Unilateral primary osteoarthritis, right knee: Secondary | ICD-10-CM | POA: Diagnosis not present

## 2022-01-24 NOTE — Progress Notes (Signed)
Reviewed and agree with documentation and assessment and plan. K. Veena Tevion Laforge , MD   

## 2022-01-30 DIAGNOSIS — M1711 Unilateral primary osteoarthritis, right knee: Secondary | ICD-10-CM | POA: Diagnosis not present

## 2022-02-06 DIAGNOSIS — M1711 Unilateral primary osteoarthritis, right knee: Secondary | ICD-10-CM | POA: Diagnosis not present

## 2022-02-06 DIAGNOSIS — S83242D Other tear of medial meniscus, current injury, left knee, subsequent encounter: Secondary | ICD-10-CM | POA: Diagnosis not present

## 2022-02-25 DIAGNOSIS — M545 Low back pain, unspecified: Secondary | ICD-10-CM | POA: Diagnosis not present

## 2022-02-25 DIAGNOSIS — R300 Dysuria: Secondary | ICD-10-CM | POA: Diagnosis not present

## 2022-02-28 DIAGNOSIS — H524 Presbyopia: Secondary | ICD-10-CM | POA: Diagnosis not present

## 2022-03-09 ENCOUNTER — Other Ambulatory Visit: Payer: Self-pay | Admitting: Gastroenterology

## 2022-03-25 ENCOUNTER — Ambulatory Visit: Payer: Medicare Other | Admitting: Gastroenterology

## 2022-04-08 DIAGNOSIS — R0609 Other forms of dyspnea: Secondary | ICD-10-CM | POA: Diagnosis not present

## 2022-04-08 DIAGNOSIS — I480 Paroxysmal atrial fibrillation: Secondary | ICD-10-CM | POA: Diagnosis not present

## 2022-04-10 DIAGNOSIS — I491 Atrial premature depolarization: Secondary | ICD-10-CM | POA: Diagnosis not present

## 2022-04-11 DIAGNOSIS — M1711 Unilateral primary osteoarthritis, right knee: Secondary | ICD-10-CM | POA: Diagnosis not present

## 2022-04-11 DIAGNOSIS — M25562 Pain in left knee: Secondary | ICD-10-CM | POA: Diagnosis not present

## 2022-05-28 ENCOUNTER — Other Ambulatory Visit: Payer: Self-pay | Admitting: Gastroenterology

## 2022-05-29 DIAGNOSIS — R0609 Other forms of dyspnea: Secondary | ICD-10-CM | POA: Diagnosis not present

## 2022-06-05 DIAGNOSIS — M1712 Unilateral primary osteoarthritis, left knee: Secondary | ICD-10-CM | POA: Diagnosis not present

## 2022-06-11 DIAGNOSIS — M1712 Unilateral primary osteoarthritis, left knee: Secondary | ICD-10-CM | POA: Diagnosis not present

## 2022-06-13 DIAGNOSIS — Z23 Encounter for immunization: Secondary | ICD-10-CM | POA: Diagnosis not present

## 2022-06-13 DIAGNOSIS — Z01810 Encounter for preprocedural cardiovascular examination: Secondary | ICD-10-CM | POA: Diagnosis not present

## 2022-06-13 DIAGNOSIS — Z01818 Encounter for other preprocedural examination: Secondary | ICD-10-CM | POA: Diagnosis not present

## 2022-06-13 DIAGNOSIS — M25561 Pain in right knee: Secondary | ICD-10-CM | POA: Diagnosis not present

## 2022-07-01 DIAGNOSIS — E119 Type 2 diabetes mellitus without complications: Secondary | ICD-10-CM | POA: Diagnosis not present

## 2022-07-12 ENCOUNTER — Other Ambulatory Visit: Payer: Self-pay | Admitting: Family Medicine

## 2022-07-12 DIAGNOSIS — Z1231 Encounter for screening mammogram for malignant neoplasm of breast: Secondary | ICD-10-CM

## 2022-07-24 ENCOUNTER — Ambulatory Visit
Admission: RE | Admit: 2022-07-24 | Discharge: 2022-07-24 | Disposition: A | Discharge status: Home / Self Care | Payer: Medicare Other | Source: Ambulatory Visit | Attending: Family Medicine | Admitting: Family Medicine

## 2022-07-24 DIAGNOSIS — Z1231 Encounter for screening mammogram for malignant neoplasm of breast: Secondary | ICD-10-CM | POA: Diagnosis not present

## 2022-07-30 ENCOUNTER — Other Ambulatory Visit: Payer: Self-pay | Admitting: Family Medicine

## 2022-07-30 DIAGNOSIS — R928 Other abnormal and inconclusive findings on diagnostic imaging of breast: Secondary | ICD-10-CM

## 2022-08-03 ENCOUNTER — Ambulatory Visit
Admission: RE | Admit: 2022-08-03 | Discharge: 2022-08-03 | Disposition: A | Discharge status: Home / Self Care | Payer: Medicare Other | Source: Ambulatory Visit | Attending: Family Medicine | Admitting: Family Medicine

## 2022-08-03 DIAGNOSIS — R928 Other abnormal and inconclusive findings on diagnostic imaging of breast: Secondary | ICD-10-CM

## 2022-08-03 DIAGNOSIS — R921 Mammographic calcification found on diagnostic imaging of breast: Secondary | ICD-10-CM | POA: Diagnosis not present

## 2022-08-05 ENCOUNTER — Other Ambulatory Visit: Payer: Self-pay | Admitting: Family Medicine

## 2022-08-05 DIAGNOSIS — R921 Mammographic calcification found on diagnostic imaging of breast: Secondary | ICD-10-CM

## 2022-08-13 ENCOUNTER — Ambulatory Visit
Admission: RE | Admit: 2022-08-13 | Discharge: 2022-08-13 | Disposition: A | Discharge status: Home / Self Care | Payer: Medicare Other | Source: Ambulatory Visit | Attending: Family Medicine | Admitting: Family Medicine

## 2022-08-13 DIAGNOSIS — R921 Mammographic calcification found on diagnostic imaging of breast: Secondary | ICD-10-CM

## 2022-08-13 DIAGNOSIS — N6489 Other specified disorders of breast: Secondary | ICD-10-CM | POA: Diagnosis not present

## 2022-08-13 HISTORY — PX: BREAST BIOPSY: SHX20

## 2022-08-15 ENCOUNTER — Ambulatory Visit: Payer: Self-pay | Admitting: Student

## 2022-08-15 DIAGNOSIS — E119 Type 2 diabetes mellitus without complications: Secondary | ICD-10-CM

## 2022-08-22 ENCOUNTER — Ambulatory Visit: Payer: Self-pay | Admitting: Student

## 2022-08-25 NOTE — Patient Instructions (Addendum)
SURGICAL WAITING ROOM VISITATION Patients having surgery or a procedure may have no more than 2 support people in the waiting area - these visitors may rotate in the visitor waiting room.   Due to an increase in RSV and influenza rates and associated hospitalizations, children ages 60 and under may not visit patients in [place]. If the patient needs to stay at the hospital during part of their recovery, the visitor guidelines for inpatient rooms apply.  PRE-OP VISITATION  Pre-op nurse will coordinate an appropriate time for 1 support person to accompany the patient in pre-op.  This support person may not rotate.  This visitor will be contacted when the time is appropriate for the visitor to come back in the pre-op area.  Please refer to the [facility] website for the visitor guidelines for Inpatients (after your surgery is over and you are in a regular room).  You are not required to quarantine at this time prior to your surgery. However, you must do this: Hand Hygiene often Do NOT share personal items Notify your provider if you are in close contact with someone who has COVID or you develop fever 100.4 or greater, new onset of sneezing, cough, sore throat, shortness of breath or body aches.  If you test positive for Covid or have been in contact with anyone that has tested positive in the last 10 days please notify you surgeon.    Your procedure is scheduled on:  Wednesday  September 04, 2022  Report to [facility] Main Entrance: [address] entrance where the [employer] is available.   Report to admitting at:  06:15 AM  +++++Call this number if you have any questions or problems the morning of surgery [phone]  Do not eat food after Midnight the night prior to your surgery/procedure.  After Midnight you may have the following liquids until  05:30 AM DAY OF SURGERY  Clear Liquid Diet Water Black Coffee (sugar ok, NO MILK/CREAM OR CREAMERS)  Tea (sugar ok, NO  MILK/CREAM OR CREAMERS) regular and decaf                             Plain Jell-O  with no fruit (NO RED)                                           Fruit ices (not with fruit pulp, NO RED)                                     Popsicles (NO RED)                                                                  Juice: apple, WHITE grape, WHITE cranberry Sports drinks like Gatorade or Powerade (NO RED)                    The day of surgery:  Drink ONE (1) Pre-Surgery G2 at  05:30 AM the morning of surgery. Drink in one sitting. Do not sip.  This drink was given to you during your hospital pre-op appointment visit. Nothing else to drink after completing the Pre-Surgery G2 : No candy, chewing gum or throat lozenges.    FOLLOW ANY ADDITIONAL PRE OP INSTRUCTIONS YOU RECEIVED FROM YOUR SURGEON'S OFFICE!!!   Oral Hygiene is also important to reduce your risk of infection.        Remember - BRUSH YOUR TEETH THE MORNING OF SURGERY WITH YOUR REGULAR TOOTHPASTE  These are anesthesia recommendations for holding your anticoagulants.  Your Cardiologist, Dr. Marvel Plan, has approved this medication hold for 72 hours. LAST DAY to take: Saturday 08-31-22  Eliquis Apixaban   72 hours hold    METFORMIN- Day before surgery: Take usual dose, no changes   DAY OF SURGERY:  DO NOT TAKE Metformin  Take ONLY these medicines the morning of surgery with A SIP OF WATER: metoprolol, omeprazole (Prilosec)                  You may not have any metal on your body including hair pins, jewelry, and body piercing  Do not wear make-up, lotions, powders, perfumes or deodorant  Do not wear nail polish including gel and S&S, artificial / acrylic nails, or any other type of covering on natural nails including finger and toenails. If you have artificial nails, gel coating, etc., that needs to be removed by a nail salon, Please have this removed prior to surgery. Not doing so may mean that your surgery could be cancelled or  delayed if the Surgeon or anesthesia staff feels like they are unable to monitor you safely.   Do not shave 48 hours prior to surgery to avoid nicks in your skin which may contribute to postoperative infections.   You may bring a small overnight bag with you on the day of surgery, only pack items that are not valuable. [facility] IS NOT RESPONSIBLE   FOR VALUABLES THAT ARE LOST OR STOLEN.   Do not bring your home medications to the hospital. The Pharmacy will dispense medications listed on your medication list to you during your admission in the Hospital.  Special Instructions: Bring a copy of your healthcare power of attorney and living will documents the day of surgery, if you wish to have them scanned into your [facility] Records- EPIC  Please read over the following fact sheets you were given: IF YOU HAVE QUESTIONS ABOUT YOUR PRE-OP INSTRUCTIONS, PLEASE CALL [phone]  ([place])   [place] - Preparing for Surgery Before surgery, you can play an important role.  Because skin is not sterile, your skin needs to be as free of germs as possible.  You can reduce the number of germs on your skin by washing with CHG (chlorahexidine gluconate) soap before surgery.  CHG is an antiseptic cleaner which kills germs and bonds with the skin to continue killing germs even after washing. Please DO NOT use if you have an allergy to CHG or antibacterial soaps.  If your skin becomes reddened/irritated stop using the CHG and inform your nurse when you arrive at Short Stay. Do not shave (including legs and underarms) for at least 48 hours prior to the first CHG shower.  You may shave your face/neck.  Please follow these instructions carefully:  1.  Shower with CHG Soap the night before surgery and the  morning of surgery.  2.  If you choose to wash your hair, wash your hair first as usual with your normal  shampoo.  3.  After  you shampoo, rinse your hair and body thoroughly to remove the shampoo.                              4.  Use CHG as you would any other liquid soap.  You can apply chg directly to the skin and wash.  Gently with a scrungie or clean washcloth.  5.  Apply the CHG Soap to your body ONLY FROM THE NECK DOWN.   Do not use on face/ open                           Wound or open sores. Avoid contact with eyes, ears mouth and genitals (private parts).                       Wash face,  Genitals (private parts) with your normal soap.             6.  Wash thoroughly, paying special attention to the area where your  surgery  will be performed.  7.  Thoroughly rinse your body with warm water from the neck down.  8.  DO NOT shower/wash with your normal soap after using and rinsing off the CHG Soap.            9.  Pat yourself dry with a clean towel.            10.  Wear clean pajamas.            11.  Place clean sheets on your bed the night of your first shower and do not  sleep with pets.  ON THE DAY OF SURGERY : Do not apply any lotions/deodorants the morning of surgery.  Please wear clean clothes to the hospital/surgery center.    FAILURE TO FOLLOW THESE INSTRUCTIONS MAY RESULT IN THE CANCELLATION OF YOUR SURGERY  PATIENT SIGNATURE_________________________________  NURSE SIGNATURE__________________________________  ________________________________________________________________________        Adam Phenix    An incentive spirometer is a tool that can help keep your lungs clear and active. This tool measures how well you are filling your lungs with each breath. Taking long deep breaths may help reverse or decrease the chance of developing breathing (pulmonary) problems (especially infection) following: A long period of time when you are unable to move or be active. BEFORE THE PROCEDURE  If the spirometer includes an indicator to show your best effort, your nurse or respiratory therapist will set it to a desired goal. If possible, sit up straight or lean slightly  forward. Try not to slouch. Hold the incentive spirometer in an upright position. INSTRUCTIONS FOR USE  Sit on the edge of your bed if possible, or sit up as far as you can in bed or on a chair. Hold the incentive spirometer in an upright position. Breathe out normally. Place the mouthpiece in your mouth and seal your lips tightly around it. Breathe in slowly and as deeply as possible, raising the piston or the ball toward the top of the column. Hold your breath for 3-5 seconds or for as long as possible. Allow the piston or ball to fall to the bottom of the column. Remove the mouthpiece from your mouth and breathe out normally. Rest for a few seconds and repeat Steps 1 through 7 at least 10 times every 1-2 hours when you are awake. Take your time and take  a few normal breaths between deep breaths. The spirometer may include an indicator to show your best effort. Use the indicator as a goal to work toward during each repetition. After each set of 10 deep breaths, practice coughing to be sure your lungs are clear. If you have an incision (the cut made at the time of surgery), support your incision when coughing by placing a pillow or rolled up towels firmly against it. Once you are able to get out of bed, walk around indoors and cough well. You may stop using the incentive spirometer when instructed by your caregiver.  RISKS AND COMPLICATIONS Take your time so you do not get dizzy or light-headed. If you are in pain, you may need to take or ask for pain medication before doing incentive spirometry. It is harder to take a deep breath if you are having pain. AFTER USE Rest and breathe slowly and easily. It can be helpful to keep track of a log of your progress. Your caregiver can provide you with a simple table to help with this. If you are using the spirometer at home, follow these instructions: [place] IF:  You are having difficultly using the spirometer. You have trouble using the  spirometer as often as instructed. Your pain medication is not giving enough relief while using the spirometer. You develop fever of 100.5 F (38.1 C) or higher.                                                                                                    SEEK IMMEDIATE MEDICAL CARE IF:  You cough up bloody sputum that had not been present before. You develop fever of 102 F (38.9 C) or greater. You develop worsening pain at or near the incision site. MAKE SURE YOU:  Understand these instructions. Will watch your condition. Will get help right away if you are not doing well or get worse. Document Released: 12/30/2006 Document Revised: 11/11/2011 Document Reviewed: 03/02/2007 [facility] Patient Information 2014 [place], [place].

## 2022-08-25 NOTE — Progress Notes (Signed)
COVID Vaccine received:  _0  No _1  Yes Date of any COVID positive Test in last 90 days: None  PCP - Claris Gower, MD    [phone] Medical Clearance on chart Cardiologist - Campbell Stall, MD  [facility] [place]    [phone] Cardiac clearance on Chart (Dr. Marvel Plan 06-26-22)  Chest x-ray -  EKG -  01-02-2022   Stress Test - 02-26-2021 ECHO - 05-29-2022  CE  ([facility]) Cardiac Cath -   PCR screen: _2  Ordered & Completed                      _3   No Order but Needs PROFEND                      _4   N/A for this surgery  Surgery Plan:  _5  Ambulatory                            _6  Outpatient in bed                            _7  Admit  Anesthesia:    _8  General  _9  Spinal                           _10   Choice _11   MAC  Pacemaker / ICD device _12  No _13  Yes        Device order form faxed _14  No    _15   Yes      Faxed to:  Spinal Cord Stimulator:_16  No _17  Yes      (Remind patient to bring remote DOS) Other Implants:   History of Sleep Apnea? _18  No _19  Yes   CPAP used?- _20  No _21  Yes    PRE-DM Does the patient monitor blood sugar? _22  No _23  Yes  _24  N/A Does patient have a [employer] or Dexacom? _25  No _26  Yes   Fasting Blood Sugar Ranges- 130-150 Checks Blood Sugar _3 times a month  Blood Thinner / Instructions:  Eliquis   OK to Hold X 72 hours  per Dr. Nena Alexander 06-28-22 note - one chart. Aspirin Instructions:  none  ERAS Protocol Ordered: _27  No  _28  Yes PRE-SURGERY _29  ENSURE  _30  G2  Patient is to be NPO after: 06:30 am  Comments: CMP done d/t Fatty Liver and recent ? LFTs  Activity level: Patient can not climb a flight of stairs without difficulty; _31  No CP but would have SOB and leg pain.  Patient can perform ADLs without assistance.   Anesthesia review: Pre-DM, A.Fib, HTN, anxiety, fatty liver, CKD3  Patient denies shortness of breath, fever, cough and chest pain at PAT appointment.  Patient verbalized understanding and agreement to  the Pre-Surgical Instructions that were given to them at this PAT appointment. Patient was also educated of the need to review these PAT instructions again prior to his/her surgery.I reviewed the appropriate phone numbers to call if they have any and questions or concerns.

## 2022-08-27 ENCOUNTER — Encounter (HOSPITAL_COMMUNITY)
Admission: RE | Admit: 2022-08-27 | Discharge: 2022-08-27 | Disposition: A | Discharge status: Home / Self Care | Payer: Medicare Other | Source: Ambulatory Visit | Attending: Orthopedic Surgery | Admitting: Orthopedic Surgery

## 2022-08-27 ENCOUNTER — Encounter (HOSPITAL_COMMUNITY): Payer: Self-pay

## 2022-08-27 ENCOUNTER — Other Ambulatory Visit: Payer: Self-pay

## 2022-08-27 VITALS — BP 138/90 | HR 82 | Temp 98.5°F | Resp 18 | Ht 65.0 in | Wt 190.0 lb

## 2022-08-27 DIAGNOSIS — Z01818 Encounter for other preprocedural examination: Secondary | ICD-10-CM

## 2022-08-27 DIAGNOSIS — Z01812 Encounter for preprocedural laboratory examination: Secondary | ICD-10-CM | POA: Insufficient documentation

## 2022-08-27 DIAGNOSIS — R7303 Prediabetes: Secondary | ICD-10-CM | POA: Insufficient documentation

## 2022-08-27 DIAGNOSIS — I10 Essential (primary) hypertension: Secondary | ICD-10-CM | POA: Diagnosis not present

## 2022-08-27 DIAGNOSIS — K769 Liver disease, unspecified: Secondary | ICD-10-CM | POA: Diagnosis not present

## 2022-08-27 HISTORY — DX: Personal history of other diseases of the digestive system: Z87.19

## 2022-08-27 HISTORY — DX: Family history of other specified conditions: Z84.89

## 2022-08-27 HISTORY — DX: Chronic kidney disease, unspecified: N18.9

## 2022-08-27 LAB — COMPREHENSIVE METABOLIC PANEL
ALT: 28 U/L (ref 0–44)
AST: 32 U/L (ref 15–41)
Albumin: 4 g/dL (ref 3.5–5.0)
Alkaline Phosphatase: 74 U/L (ref 38–126)
Anion gap: 12 (ref 5–15)
BUN: 18 mg/dL (ref 8–23)
CO2: 22 mmol/L (ref 22–32)
Calcium: 9 mg/dL (ref 8.9–10.3)
Chloride: 107 mmol/L (ref 98–111)
Creatinine, Ser: 0.85 mg/dL (ref 0.44–1.00)
GFR, Estimated: >60 mL/min (ref >60)
Glucose, Bld: 144 mg/dL — ABNORMAL HIGH (ref 70–99)
Potassium: 4.4 mmol/L (ref 3.5–5.1)
Sodium: 141 mmol/L (ref 135–145)
Total Bilirubin: 0.4 mg/dL (ref 0.3–1.2)
Total Protein: 7.4 g/dL (ref 6.5–8.1)

## 2022-08-27 LAB — CBC
HCT: 40.6 % (ref 36.0–46.0)
Hemoglobin: 12.9 g/dL (ref 12.0–15.0)
MCH: 28.6 pg (ref 26.0–34.0)
MCHC: 31.8 g/dL (ref 30.0–36.0)
MCV: 90 fL (ref 80.0–100.0)
Platelets: 286 10*3/uL (ref 150–400)
RBC: 4.51 MIL/uL (ref 3.87–5.11)
RDW: 14 % (ref 11.5–15.5)
WBC: 13 10*3/uL — ABNORMAL HIGH (ref 4.0–10.5)
nRBC: 0 % (ref 0.0–0.2)

## 2022-08-27 LAB — SURGICAL PCR SCREEN
MRSA, PCR: NEGATIVE
Staphylococcus aureus: NEGATIVE

## 2022-08-27 LAB — GLUCOSE, CAPILLARY: Glucose-Capillary: 125 mg/dL — ABNORMAL HIGH (ref 70–99)

## 2022-08-28 ENCOUNTER — Ambulatory Visit: Payer: Self-pay | Admitting: Student

## 2022-08-28 LAB — HEMOGLOBIN A1C
Hgb A1c MFr Bld: 6.1 % — ABNORMAL HIGH (ref 4.8–5.6)
Mean Plasma Glucose: 128 mg/dL

## 2022-08-28 NOTE — H&P (View-Only) (Signed)
TOTAL KNEE ADMISSION H&P  Patient is being admitted for left total knee arthroplasty.  Subjective:  Chief Complaint:left knee pain.  HPI: Dorothy Mcdonald, 76 y.o. female, has a history of pain and functional disability in the left knee due to arthritis and has failed non-surgical conservative treatments for greater than 12 weeks to includeNSAID's and/or analgesics, corticosteriod injections, viscosupplementation injections, flexibility and strengthening excercises, use of assistive devices, and activity modification.  Onset of symptoms was gradual, starting 5 years ago with rapidlly worsening course since that time. The patient noted prior procedures on the knee to include  arthroscopy on the left knee(s).  Patient currently rates pain in the left knee(s) at 10 out of 10 with activity. Patient has night pain, worsening of pain with activity and weight bearing, pain that interferes with activities of daily living, pain with passive range of motion, crepitus, and joint swelling.  Patient has evidence of subchondral cysts, subchondral sclerosis, periarticular osteophytes, and joint space narrowing by imaging studies. There is no active infection.  Patient Active Problem List   Diagnosis Date Noted   Anxiety 02/23/2021   Palpitations 02/23/2021   Gastroesophageal reflux disease without esophagitis 12/24/2018   Paroxysmal atrial fibrillation ([place]) 03/24/2017   Constipation due to outlet dysfunction    Past Medical History:  Diagnosis Date   Adenomatous colon polyp 2003   Anxiety    Arthritis    Atrial fibrillation (HCC)    Chronic kidney disease    Depression    Dysrhythmia    Family history of adverse reaction to anesthesia    sister- problems waking up after anes. took several days to recover fully   Gallstones    GERD (gastroesophageal reflux disease)    Hepatic steatosis    History of hiatal hernia    Hyperlipidemia    IBS (irritable bowel syndrome)    Internal hemorrhoids     Irregular heart beat    Peptic ulcer disease    Rotator cuff disorder, left    TMJ (temporomandibular joint disorder)    Vitamin D deficiency     Past Surgical History:  Procedure Laterality Date   ANAL RECTAL MANOMETRY N/A 11/22/2016   Procedure: ANO RECTAL MANOMETRY;  Surgeon: Mauri Pole, MD;  Location: [facility];  Service: Endoscopy;  Laterality: N/A;   BREAST BIOPSY Right 08/13/2022   MM RT BREAST BX W LOC DEV 1ST LESION IMAGE BX SPEC STEREO GUIDE 08/13/2022 [facility]   BREAST LUMPECTOMY Right    CHOLECYSTECTOMY     COLONOSCOPY     HAND SURGERY     3 hand surgeries, Left hand- trigger fingers x 2,  right hand x 1   KNEE SURGERY     PARTIAL HYSTERECTOMY  1983   POLYPECTOMY     RECTOCELE AND CYSTOCELE REPAIR     TONSILLECTOMY      Current Outpatient Medications  Medication Sig Dispense Refill Last Dose   apixaban (ELIQUIS) 5 MG TABS tablet Take 5 mg by mouth 2 (two) times daily.      furosemide (LASIX) 80 MG tablet Take 80 mg by mouth daily.      MAGNESIUM PO Take 1 tablet by mouth daily as needed (leg cramps).      metFORMIN (GLUCOPHAGE) 1000 MG tablet Take 1,000 mg by mouth 2 (two) times daily with a meal.      metoprolol succinate (TOPROL-XL) 25 MG 24 hr tablet Take 1 tablet (25 mg total) by mouth daily. (Patient taking differently: Take 50 mg by  mouth daily.) 30 tablet 0    nitroGLYCERIN (NITROSTAT) 0.4 MG SL tablet Place 1 tablet (0.4 mg total) under the tongue every 5 (five) minutes as needed for chest pain. 30 tablet 1    omeprazole (PRILOSEC) 40 MG capsule TAKE ONE CAPSULE BY MOUTH EVERY DAY 90 capsule 3    ondansetron (ZOFRAN) 4 MG tablet Take 1 tablet (4 mg total) by mouth every 8 (eight) hours as needed for nausea or vomiting. (Patient not taking: Reported on 08/22/2022) 30 tablet 1    rosuvastatin (CRESTOR) 10 MG tablet Take 1 tablet (10 mg total) by mouth daily. 30 tablet 0    venlafaxine XR (EFFEXOR-XR) 150 MG 24 hr capsule Take 150 mg by mouth  daily.      No current facility-administered medications for this visit.   Allergies  Allergen Reactions   Amoxicillin-Pot Clavulanate Nausea And Vomiting    None listed per [place].   Penicillins Nausea And Vomiting    Patient denies    Social History   Tobacco Use   Smoking status: Former    Types: Cigarettes    Quit date: 09/02/1984    Years since quitting: 38.0   Smokeless tobacco: Never  Substance Use Topics   Alcohol use: No    Family History  Problem Relation Age of Onset   Colon cancer Father        DIED AT 33   Colon cancer Paternal Aunt    Colon cancer Paternal Uncle        x 3   Ovarian cancer Mother    Uterine cancer Mother    Breast cancer Sister    Esophageal cancer Neg Hx    Rectal cancer Neg Hx    Stomach cancer Neg Hx      Review of Systems  Musculoskeletal:  Positive for arthralgias, gait problem and joint swelling.  All other systems reviewed and are negative.   Objective:  Physical Exam Constitutional:      Appearance: Normal appearance.  HENT:     Head: Normocephalic and atraumatic.     Nose: Nose normal.     Mouth/Throat:     Mouth: Mucous membranes are moist.     Pharynx: Oropharynx is clear.  Eyes:     Conjunctiva/sclera: Conjunctivae normal.  Cardiovascular:     Rate and Rhythm: Normal rate and regular rhythm.     Pulses: Normal pulses.     Heart sounds: Normal heart sounds.  Pulmonary:     Effort: Pulmonary effort is normal.     Breath sounds: Normal breath sounds.  Abdominal:     General: Abdomen is flat.     Palpations: Abdomen is soft.  Genitourinary:    Comments: Deferred. Musculoskeletal:     Cervical back: Normal range of motion.     Comments: Examination left knee reveals healed arthroscopy portals. She has swelling, trace effusion. No warmth or erythema. Mild varus deformity. Tenderness to palpation medial joint line, lateral joint, peripatellar retinacular tissues with a positive grind sign.  Range of motion 0 to 120 degrees without any ligamentous instability. Painless range of motion hip  Neurovascular intact distally.  Skin:    General: Skin is warm and dry.     Capillary Refill: Capillary refill takes less than 2 seconds.  Neurological:     General: No focal deficit present.     Mental Status: She is alert and oriented to person, place, and time.  Psychiatric:  Mood and Affect: Mood normal.        Behavior: Behavior normal.        Thought Content: Thought content normal.        Judgment: Judgment normal.     Vital signs in last 24 hours: '@VSRANGES'$ @  Labs:   Estimated body mass index is 31.62 kg/m as calculated from the following:   Height as of 08/27/22: '5\' 5"'$  (1.651 m).   Weight as of 08/27/22: 86.2 kg.   Imaging Review Plain radiographs demonstrate severe degenerative joint disease of the left knee(s). The overall alignment ismild varus. The bone quality appears to be adequate for age and reported activity level.      Assessment/Plan:  End stage arthritis, left knee   The patient history, physical examination, clinical judgment of the provider and imaging studies are consistent with end stage degenerative joint disease of the left knee(s) and total knee arthroplasty is deemed medically necessary. The treatment options including medical management, injection therapy arthroscopy and arthroplasty were discussed at length. The risks and benefits of total knee arthroplasty were presented and reviewed. The risks due to aseptic loosening, infection, stiffness, patella tracking problems, thromboembolic complications and other imponderables were discussed. The patient acknowledged the explanation, agreed to proceed with the plan and consent was signed. Patient is being admitted for inpatient treatment for surgery, pain control, PT, OT, prophylactic antibiotics, VTE prophylaxis, progressive ambulation and ADL's and discharge planning. The patient is planning to  be discharged home with OPPT after an overnight stay.   Therapy Plans: outpatient therapy at [facility]. 1st PT appt 09/09/21.  Disposition: Home with husband and kids.  Planned DVT Prophylaxis: Eliquis  '5mg'$  BID per baseline.  DME needed: Has rolling walker. Has iceman.  PCP: Cleared. Cardiology: Cleared. Hold Eliquis 3 days prior to surgery.  TXA: IV Allergies:  - Amoxicillin - Nausea/Vomiting Anesthesia Concerns: None.  BMI: 31.3 Last HgbA1c: 6.2 Other: - Afib - on Eliquis. - T2DM - metformin.  - History left knee arthroscopy, Dr. Stann Mainland. March 2023.  - Oxycodone, zofran.  - NO NSAIDs.  - 06/13/22: Hgb 12.2, Cr. 0.88, K+ 4.4 - 08/27/22: Hgb 12.9, Cr. 0.85.  - Handicap placard given today per patient request.    Patient's anticipated LOS is less than 2 midnights, meeting these requirements: - Younger than 62 - Lives within 1 hour of care - Has a competent adult at home to recover with post-op recover - NO history of  - Chronic pain requiring opiods  - Diabetes  - Coronary Artery Disease  - Heart failure  - Heart attack  - Stroke  - DVT/VTE  - Cardiac arrhythmia  - Respiratory Failure/COPD  - Renal failure  - Anemia  - Advanced Liver disease

## 2022-08-28 NOTE — H&P (Signed)
TOTAL KNEE ADMISSION H&P  Patient is being admitted for left total knee arthroplasty.  Subjective:  Chief Complaint:left knee pain.  HPI: Dorothy Mcdonald, 76 y.o. female, has a history of pain and functional disability in the left knee due to arthritis and has failed non-surgical conservative treatments for greater than 12 weeks to includeNSAID's and/or analgesics, corticosteriod injections, viscosupplementation injections, flexibility and strengthening excercises, use of assistive devices, and activity modification.  Onset of symptoms was gradual, starting 5 years ago with rapidlly worsening course since that time. The patient noted prior procedures on the knee to include  arthroscopy on the left knee(s).  Patient currently rates pain in the left knee(s) at 10 out of 10 with activity. Patient has night pain, worsening of pain with activity and weight bearing, pain that interferes with activities of daily living, pain with passive range of motion, crepitus, and joint swelling.  Patient has evidence of subchondral cysts, subchondral sclerosis, periarticular osteophytes, and joint space narrowing by imaging studies. There is no active infection.  Patient Active Problem List   Diagnosis Date Noted   Anxiety 02/23/2021   Palpitations 02/23/2021   Gastroesophageal reflux disease without esophagitis 12/24/2018   Paroxysmal atrial fibrillation ([place]) 03/24/2017   Constipation due to outlet dysfunction    Past Medical History:  Diagnosis Date   Adenomatous colon polyp 2003   Anxiety    Arthritis    Atrial fibrillation ([facility])    Chronic kidney disease    Depression    Dysrhythmia    Family history of adverse reaction to anesthesia    sister- problems waking up after anes. took several days to recover fully   Gallstones    GERD (gastroesophageal reflux disease)    Hepatic steatosis    History of hiatal hernia    Hyperlipidemia    IBS (irritable bowel syndrome)    Internal hemorrhoids     Irregular heart beat    Peptic ulcer disease    Rotator cuff disorder, left    TMJ (temporomandibular joint disorder)    Vitamin D deficiency     Past Surgical History:  Procedure Laterality Date   ANAL RECTAL MANOMETRY N/A 11/22/2016   Procedure: ANO RECTAL MANOMETRY;  Surgeon: Mauri Pole, MD;  Location: [facility];  Service: Endoscopy;  Laterality: N/A;   BREAST BIOPSY Right 08/13/2022   MM RT BREAST BX W LOC DEV 1ST LESION IMAGE BX SPEC STEREO GUIDE 08/13/2022 [facility]   BREAST LUMPECTOMY Right    CHOLECYSTECTOMY     COLONOSCOPY     HAND SURGERY     3 hand surgeries, Left hand- trigger fingers x 2,  right hand x 1   KNEE SURGERY     PARTIAL HYSTERECTOMY  1983   POLYPECTOMY     RECTOCELE AND CYSTOCELE REPAIR     TONSILLECTOMY      Current Outpatient Medications  Medication Sig Dispense Refill Last Dose   apixaban (ELIQUIS) 5 MG TABS tablet Take 5 mg by mouth 2 (two) times daily.      furosemide (LASIX) 80 MG tablet Take 80 mg by mouth daily.      MAGNESIUM PO Take 1 tablet by mouth daily as needed (leg cramps).      metFORMIN (GLUCOPHAGE) 1000 MG tablet Take 1,000 mg by mouth 2 (two) times daily with a meal.      metoprolol succinate (TOPROL-XL) 25 MG 24 hr tablet Take 1 tablet (25 mg total) by mouth daily. (Patient taking differently: Take 50 mg by  mouth daily.) 30 tablet 0    nitroGLYCERIN (NITROSTAT) 0.4 MG SL tablet Place 1 tablet (0.4 mg total) under the tongue every 5 (five) minutes as needed for chest pain. 30 tablet 1    omeprazole (PRILOSEC) 40 MG capsule TAKE ONE CAPSULE BY MOUTH EVERY DAY 90 capsule 3    ondansetron (ZOFRAN) 4 MG tablet Take 1 tablet (4 mg total) by mouth every 8 (eight) hours as needed for nausea or vomiting. (Patient not taking: Reported on 08/22/2022) 30 tablet 1    rosuvastatin (CRESTOR) 10 MG tablet Take 1 tablet (10 mg total) by mouth daily. 30 tablet 0    venlafaxine XR (EFFEXOR-XR) 150 MG 24 hr capsule Take 150 mg by mouth  daily.      No current facility-administered medications for this visit.   Allergies  Allergen Reactions   Amoxicillin-Pot Clavulanate Nausea And Vomiting    None listed per Zena.   Penicillins Nausea And Vomiting    Patient denies    Social History   Tobacco Use   Smoking status: Former    Types: Cigarettes    Quit date: 09/02/1984    Years since quitting: 38.0   Smokeless tobacco: Never  Substance Use Topics   Alcohol use: No    Family History  Problem Relation Age of Onset   Colon cancer Father        DIED AT 65   Colon cancer Paternal Aunt    Colon cancer Paternal Uncle        x 3   Ovarian cancer Mother    Uterine cancer Mother    Breast cancer Sister    Esophageal cancer Neg Hx    Rectal cancer Neg Hx    Stomach cancer Neg Hx      Review of Systems  Musculoskeletal:  Positive for arthralgias, gait problem and joint swelling.  All other systems reviewed and are negative.   Objective:  Physical Exam Constitutional:      Appearance: Normal appearance.  HENT:     Head: Normocephalic and atraumatic.     Nose: Nose normal.     Mouth/Throat:     Mouth: Mucous membranes are moist.     Pharynx: Oropharynx is clear.  Eyes:     Conjunctiva/sclera: Conjunctivae normal.  Cardiovascular:     Rate and Rhythm: Normal rate and regular rhythm.     Pulses: Normal pulses.     Heart sounds: Normal heart sounds.  Pulmonary:     Effort: Pulmonary effort is normal.     Breath sounds: Normal breath sounds.  Abdominal:     General: Abdomen is flat.     Palpations: Abdomen is soft.  Genitourinary:    Comments: Deferred. Musculoskeletal:     Cervical back: Normal range of motion.     Comments: Examination left knee reveals healed arthroscopy portals. She has swelling, trace effusion. No warmth or erythema. Mild varus deformity. Tenderness to palpation medial joint line, lateral joint, peripatellar retinacular tissues with a positive grind sign.  Range of motion 0 to 120 degrees without any ligamentous instability. Painless range of motion hip  Neurovascular intact distally.  Skin:    General: Skin is warm and dry.     Capillary Refill: Capillary refill takes less than 2 seconds.  Neurological:     General: No focal deficit present.     Mental Status: She is alert and oriented to person, place, and time.  Psychiatric:  Mood and Affect: Mood normal.        Behavior: Behavior normal.        Thought Content: Thought content normal.        Judgment: Judgment normal.     Vital signs in last 24 hours: '@VSRANGES'$ @  Labs:   Estimated body mass index is 31.62 kg/m as calculated from the following:   Height as of 08/27/22: '5\' 5"'$  (1.651 m).   Weight as of 08/27/22: 86.2 kg.   Imaging Review Plain radiographs demonstrate severe degenerative joint disease of the left knee(s). The overall alignment ismild varus. The bone quality appears to be adequate for age and reported activity level.      Assessment/Plan:  End stage arthritis, left knee   The patient history, physical examination, clinical judgment of the provider and imaging studies are consistent with end stage degenerative joint disease of the left knee(s) and total knee arthroplasty is deemed medically necessary. The treatment options including medical management, injection therapy arthroscopy and arthroplasty were discussed at length. The risks and benefits of total knee arthroplasty were presented and reviewed. The risks due to aseptic loosening, infection, stiffness, patella tracking problems, thromboembolic complications and other imponderables were discussed. The patient acknowledged the explanation, agreed to proceed with the plan and consent was signed. Patient is being admitted for inpatient treatment for surgery, pain control, PT, OT, prophylactic antibiotics, VTE prophylaxis, progressive ambulation and ADL's and discharge planning. The patient is planning to  be discharged home with OPPT after an overnight stay.   Therapy Plans: outpatient therapy at [facility]. 1st PT appt 09/09/21.  Disposition: Home with husband and kids.  Planned DVT Prophylaxis: Eliquis  '5mg'$  BID per baseline.  DME needed: Has rolling walker. Has iceman.  PCP: Cleared. Cardiology: Cleared. Hold Eliquis 3 days prior to surgery.  TXA: IV Allergies:  - Amoxicillin - Nausea/Vomiting Anesthesia Concerns: None.  BMI: 31.3 Last HgbA1c: 6.2 Other: - Afib - on Eliquis. - T2DM - metformin.  - History left knee arthroscopy, Dr. Stann Mainland. March 2023.  - Oxycodone, zofran.  - NO NSAIDs.  - 06/13/22: Hgb 12.2, Cr. 0.88, K+ 4.4 - 08/27/22: Hgb 12.9, Cr. 0.85.  - Handicap placard given today per patient request.    Patient's anticipated LOS is less than 2 midnights, meeting these requirements: - Younger than 68 - Lives within 1 hour of care - Has a competent adult at home to recover with post-op recover - NO history of  - Chronic pain requiring opiods  - Diabetes  - Coronary Artery Disease  - Heart failure  - Heart attack  - Stroke  - DVT/VTE  - Cardiac arrhythmia  - Respiratory Failure/COPD  - Renal failure  - Anemia  - Advanced Liver disease

## 2022-09-03 NOTE — Anesthesia Preprocedure Evaluation (Signed)
Anesthesia Evaluation  Patient identified by MRN, date of birth, ID band Patient awake    Reviewed: Allergy & Precautions, NPO status , Patient's Chart, lab work & pertinent test results  Airway Mallampati: II  TM Distance: >3 FB Neck ROM: Full    Dental no notable dental hx. (+) Teeth Intact, Dental Advisory Given   Pulmonary former smoker   Pulmonary exam normal breath sounds clear to auscultation       Cardiovascular Normal cardiovascular exam+ dysrhythmias Atrial Fibrillation  Rhythm:Regular Rate:Normal     Neuro/Psych  PSYCHIATRIC DISORDERS Anxiety Depression       GI/Hepatic hiatal hernia,GERD  Medicated and Controlled,,  Endo/Other    Renal/GU Renal InsufficiencyLab Results      Component                Value               Date                      CREATININE               0.85                08/27/2022                BUN                      18                  08/27/2022                NA                       141                 08/27/2022                K                        4.4                 08/27/2022                CL                       107                 08/27/2022                CO2                      22                  08/27/2022                Musculoskeletal  (+) Arthritis ,    Abdominal   Peds  Hematology .Lab Results      Component                Value               Date                      WBC  13.0 (H)            08/27/2022                HGB                      12.9                08/27/2022                HCT                      40.6                08/27/2022                MCV                      90.0                08/27/2022                PLT                      286                 08/27/2022              Anesthesia Other Findings All:  PCN, Amoxicillin clavulanate  Reproductive/Obstetrics                               Anesthesia Physical Anesthesia Plan  ASA: 3  Anesthesia Plan: Spinal and Regional   Post-op Pain Management: Regional block* and Minimal or no pain anticipated   Induction:   PONV Risk Score and Plan: 3 and Treatment may vary due to age or medical condition and Ondansetron  Airway Management Planned: Natural Airway and Nasal Cannula  Additional Equipment: None  Intra-op Plan:   Post-operative Plan:   Informed Consent: I have reviewed the patients History and Physical, chart, labs and discussed the procedure including the risks, benefits and alternatives for the proposed anesthesia with the patient or authorized representative who has indicated his/her understanding and acceptance.     Dental advisory given  Plan Discussed with:   Anesthesia Plan Comments: (L Adductor + Spinal)         Anesthesia Quick Evaluation

## 2022-09-04 ENCOUNTER — Encounter (HOSPITAL_COMMUNITY): Payer: Self-pay | Admitting: Orthopedic Surgery

## 2022-09-04 ENCOUNTER — Encounter (HOSPITAL_COMMUNITY)
Admission: RE | Disposition: A | Discharge status: Home / Self Care | Payer: Self-pay | Source: Ambulatory Visit | Attending: Orthopedic Surgery

## 2022-09-04 ENCOUNTER — Ambulatory Visit (HOSPITAL_COMMUNITY): Payer: Medicare Other | Admitting: Physician Assistant

## 2022-09-04 ENCOUNTER — Other Ambulatory Visit: Payer: Self-pay

## 2022-09-04 ENCOUNTER — Ambulatory Visit (HOSPITAL_COMMUNITY): Payer: Medicare Other

## 2022-09-04 ENCOUNTER — Ambulatory Visit (HOSPITAL_COMMUNITY): Payer: Medicare Other | Admitting: Anesthesiology

## 2022-09-04 ENCOUNTER — Ambulatory Visit (HOSPITAL_COMMUNITY)
Admission: RE | Admit: 2022-09-04 | Discharge: 2022-09-05 | Disposition: A | Discharge status: Home / Self Care | Payer: Medicare Other | Source: Ambulatory Visit | Attending: Orthopedic Surgery | Admitting: Orthopedic Surgery

## 2022-09-04 DIAGNOSIS — K449 Diaphragmatic hernia without obstruction or gangrene: Secondary | ICD-10-CM | POA: Diagnosis not present

## 2022-09-04 DIAGNOSIS — Z87891 Personal history of nicotine dependence: Secondary | ICD-10-CM | POA: Diagnosis not present

## 2022-09-04 DIAGNOSIS — Z471 Aftercare following joint replacement surgery: Secondary | ICD-10-CM | POA: Diagnosis not present

## 2022-09-04 DIAGNOSIS — K219 Gastro-esophageal reflux disease without esophagitis: Secondary | ICD-10-CM | POA: Diagnosis not present

## 2022-09-04 DIAGNOSIS — I4891 Unspecified atrial fibrillation: Secondary | ICD-10-CM | POA: Diagnosis not present

## 2022-09-04 DIAGNOSIS — M1712 Unilateral primary osteoarthritis, left knee: Secondary | ICD-10-CM | POA: Insufficient documentation

## 2022-09-04 DIAGNOSIS — Z96652 Presence of left artificial knee joint: Secondary | ICD-10-CM | POA: Diagnosis not present

## 2022-09-04 DIAGNOSIS — F32A Depression, unspecified: Secondary | ICD-10-CM | POA: Insufficient documentation

## 2022-09-04 DIAGNOSIS — G8918 Other acute postprocedural pain: Secondary | ICD-10-CM | POA: Diagnosis not present

## 2022-09-04 DIAGNOSIS — R7303 Prediabetes: Secondary | ICD-10-CM

## 2022-09-04 DIAGNOSIS — F418 Other specified anxiety disorders: Secondary | ICD-10-CM | POA: Diagnosis not present

## 2022-09-04 HISTORY — PX: KNEE ARTHROPLASTY: SHX992

## 2022-09-04 LAB — GLUCOSE, CAPILLARY
Glucose-Capillary: 137 mg/dL — ABNORMAL HIGH (ref 70–99)
Glucose-Capillary: 227 mg/dL — ABNORMAL HIGH (ref 70–99)
Glucose-Capillary: 193 mg/dL — ABNORMAL HIGH (ref 70–99)

## 2022-09-04 SURGERY — ARTHROPLASTY, KNEE, TOTAL, USING IMAGELESS COMPUTER-ASSISTED NAVIGATION
Anesthesia: Regional | Site: Knee | Laterality: Left

## 2022-09-04 MED ORDER — LIDOCAINE 2% (20 MG/ML) 5 ML SYRINGE
INTRAMUSCULAR | Status: DC | PRN
  Administered 2022-09-04: 60 mg via INTRAVENOUS

## 2022-09-04 MED ORDER — SODIUM CHLORIDE 0.9 % IV SOLN: INTRAVENOUS | Status: DC

## 2022-09-04 MED ORDER — LACTATED RINGERS IV SOLN: INTRAVENOUS | Status: DC

## 2022-09-04 MED ORDER — DEXAMETHASONE SODIUM PHOSPHATE 10 MG/ML IJ SOLN
INTRAMUSCULAR | Status: AC
  Filled 2022-09-04: qty 1

## 2022-09-04 MED ORDER — APIXABAN 2.5 MG PO TABS
2.5000 mg | ORAL_TABLET | Freq: Two times a day (BID) | ORAL | Status: DC
  Administered 2022-09-05: 2.5 mg via ORAL
  Filled 2022-09-04: qty 1

## 2022-09-04 MED ORDER — SODIUM CHLORIDE 0.9 % IR SOLN
Status: DC | PRN
  Administered 2022-09-04: 1000 mL

## 2022-09-04 MED ORDER — CELECOXIB 200 MG PO CAPS
200.0000 mg | ORAL_CAPSULE | Freq: Two times a day (BID) | ORAL | Status: DC
  Administered 2022-09-04 – 2022-09-05 (×3): 200 mg via ORAL
  Filled 2022-09-04 (×3): qty 1

## 2022-09-04 MED ORDER — SODIUM CHLORIDE (PF) 0.9 % IJ SOLN
INTRAMUSCULAR | Status: AC
  Filled 2022-09-04: qty 30

## 2022-09-04 MED ORDER — POVIDONE-IODINE 10 % EX SWAB
2.0000 | Freq: Once | CUTANEOUS | Status: AC
  Administered 2022-09-04: 2 via TOPICAL

## 2022-09-04 MED ORDER — PHENOL 1.4 % MT LIQD: 1.0000 | OROMUCOSAL | Status: DC | PRN

## 2022-09-04 MED ORDER — DEXAMETHASONE SODIUM PHOSPHATE 10 MG/ML IJ SOLN
INTRAMUSCULAR | Status: DC | PRN
  Administered 2022-09-04: 10 mg via INTRAVENOUS

## 2022-09-04 MED ORDER — INSULIN ASPART 100 UNIT/ML IJ SOLN: 0.0000 [IU] | Freq: Every day | INTRAMUSCULAR | Status: DC

## 2022-09-04 MED ORDER — DOCUSATE SODIUM 100 MG PO CAPS
100.0000 mg | ORAL_CAPSULE | Freq: Two times a day (BID) | ORAL | Status: DC
  Administered 2022-09-04: 100 mg via ORAL
  Filled 2022-09-04 (×2): qty 1

## 2022-09-04 MED ORDER — POVIDONE-IODINE 10 % EX SWAB: 2.0000 | Freq: Once | CUTANEOUS | Status: AC

## 2022-09-04 MED ORDER — ONDANSETRON HCL 4 MG/2ML IJ SOLN: 4.0000 mg | Freq: Four times a day (QID) | INTRAMUSCULAR | Status: DC | PRN

## 2022-09-04 MED ORDER — ROPIVACAINE HCL 5 MG/ML IJ SOLN
INTRAMUSCULAR | Status: DC | PRN
  Administered 2022-09-04: 30 mL via PERINEURAL

## 2022-09-04 MED ORDER — MIDAZOLAM HCL 2 MG/2ML IJ SOLN
INTRAMUSCULAR | Status: AC
  Filled 2022-09-04: qty 2

## 2022-09-04 MED ORDER — PROPOFOL 10 MG/ML IV BOLUS
INTRAVENOUS | Status: DC | PRN
  Administered 2022-09-04: 30 mg via INTRAVENOUS
  Administered 2022-09-04: 20 mg via INTRAVENOUS
  Administered 2022-09-04: 50 mg via INTRAVENOUS

## 2022-09-04 MED ORDER — MIDAZOLAM HCL 2 MG/2ML IJ SOLN
INTRAMUSCULAR | Status: DC | PRN
  Administered 2022-09-04: 1 mg via INTRAVENOUS

## 2022-09-04 MED ORDER — CLONIDINE HCL (ANALGESIA) 100 MCG/ML EP SOLN
EPIDURAL | Status: DC | PRN
  Administered 2022-09-04: 100 ug

## 2022-09-04 MED ORDER — CEFAZOLIN SODIUM-DEXTROSE 2-4 GM/100ML-% IV SOLN
2.0000 g | Freq: Four times a day (QID) | INTRAVENOUS | Status: AC
  Administered 2022-09-04 (×2): 2 g via INTRAVENOUS
  Filled 2022-09-04 (×2): qty 100

## 2022-09-04 MED ORDER — ONDANSETRON HCL 4 MG/2ML IJ SOLN: 4.0000 mg | Freq: Once | INTRAMUSCULAR | Status: DC | PRN

## 2022-09-04 MED ORDER — METHOCARBAMOL 500 MG IVPB - SIMPLE MED
500.0000 mg | Freq: Four times a day (QID) | INTRAVENOUS | Status: DC | PRN
  Filled 2022-09-04: qty 55

## 2022-09-04 MED ORDER — ONDANSETRON HCL 4 MG PO TABS
4.0000 mg | ORAL_TABLET | Freq: Four times a day (QID) | ORAL | Status: DC | PRN
  Administered 2022-09-05: 4 mg via ORAL
  Filled 2022-09-04: qty 1

## 2022-09-04 MED ORDER — MAGNESIUM OXIDE -MG SUPPLEMENT 400 (240 MG) MG PO TABS: 200.0000 mg | ORAL_TABLET | Freq: Every day | ORAL | Status: DC | PRN

## 2022-09-04 MED ORDER — KETOROLAC TROMETHAMINE 30 MG/ML IJ SOLN
INTRAMUSCULAR | Status: AC
  Filled 2022-09-04: qty 1

## 2022-09-04 MED ORDER — FENTANYL CITRATE PF 50 MCG/ML IJ SOSY: 25.0000 ug | PREFILLED_SYRINGE | INTRAMUSCULAR | Status: DC | PRN

## 2022-09-04 MED ORDER — PROPOFOL 10 MG/ML IV BOLUS
INTRAVENOUS | Status: AC
  Filled 2022-09-04: qty 20

## 2022-09-04 MED ORDER — BUPIVACAINE IN DEXTROSE 0.75-8.25 % IT SOLN
INTRATHECAL | Status: DC | PRN
  Administered 2022-09-04: 13.5 mg via INTRATHECAL

## 2022-09-04 MED ORDER — INSULIN ASPART 100 UNIT/ML IJ SOLN
0.0000 [IU] | Freq: Three times a day (TID) | INTRAMUSCULAR | Status: DC
  Administered 2022-09-04: 3 [IU] via SUBCUTANEOUS
  Administered 2022-09-05: 1 [IU] via SUBCUTANEOUS

## 2022-09-04 MED ORDER — METOCLOPRAMIDE HCL 5 MG/ML IJ SOLN: 5.0000 mg | Freq: Three times a day (TID) | INTRAMUSCULAR | Status: DC | PRN

## 2022-09-04 MED ORDER — METOPROLOL SUCCINATE ER 50 MG PO TB24
50.0000 mg | ORAL_TABLET | Freq: Every day | ORAL | Status: DC
  Administered 2022-09-05: 50 mg via ORAL
  Filled 2022-09-04: qty 1

## 2022-09-04 MED ORDER — SODIUM CHLORIDE 0.9 % IR SOLN
Status: DC | PRN
  Administered 2022-09-04: 3000 mL

## 2022-09-04 MED ORDER — PHENYLEPHRINE 80 MCG/ML (10ML) SYRINGE FOR IV PUSH (FOR BLOOD PRESSURE SUPPORT)
PREFILLED_SYRINGE | INTRAVENOUS | Status: DC | PRN
  Administered 2022-09-04: 80 ug via INTRAVENOUS
  Administered 2022-09-04 (×2): 160 ug via INTRAVENOUS

## 2022-09-04 MED ORDER — POLYETHYLENE GLYCOL 3350 17 G PO PACK: 17.0000 g | PACK | Freq: Every day | ORAL | Status: DC | PRN

## 2022-09-04 MED ORDER — ORAL CARE MOUTH RINSE: 15.0000 mL | Freq: Once | OROMUCOSAL | Status: AC

## 2022-09-04 MED ORDER — FUROSEMIDE 40 MG PO TABS
80.0000 mg | ORAL_TABLET | Freq: Every day | ORAL | Status: DC
  Administered 2022-09-05: 80 mg via ORAL
  Filled 2022-09-04: qty 2

## 2022-09-04 MED ORDER — FENTANYL CITRATE (PF) 100 MCG/2ML IJ SOLN
INTRAMUSCULAR | Status: AC
  Filled 2022-09-04: qty 2

## 2022-09-04 MED ORDER — BUPIVACAINE-EPINEPHRINE (PF) 0.5% -1:200000 IJ SOLN
INTRAMUSCULAR | Status: AC
  Filled 2022-09-04: qty 30

## 2022-09-04 MED ORDER — TRANEXAMIC ACID-NACL 1000-0.7 MG/100ML-% IV SOLN
1000.0000 mg | INTRAVENOUS | Status: AC
  Administered 2022-09-04: 1000 mg via INTRAVENOUS
  Filled 2022-09-04: qty 100

## 2022-09-04 MED ORDER — BUPIVACAINE-EPINEPHRINE 0.5% -1:200000 IJ SOLN
INTRAMUSCULAR | Status: DC | PRN
  Administered 2022-09-04: 30 mL

## 2022-09-04 MED ORDER — ACETAMINOPHEN 325 MG PO TABS: 325.0000 mg | ORAL_TABLET | Freq: Four times a day (QID) | ORAL | Status: DC | PRN

## 2022-09-04 MED ORDER — 0.9 % SODIUM CHLORIDE (POUR BTL) OPTIME
TOPICAL | Status: DC | PRN
  Administered 2022-09-04: 1000 mL

## 2022-09-04 MED ORDER — ACETAMINOPHEN 500 MG PO TABS
1000.0000 mg | ORAL_TABLET | Freq: Once | ORAL | Status: AC
  Administered 2022-09-04: 1000 mg via ORAL
  Filled 2022-09-04: qty 2

## 2022-09-04 MED ORDER — CEFAZOLIN SODIUM-DEXTROSE 2-4 GM/100ML-% IV SOLN
2.0000 g | INTRAVENOUS | Status: AC
  Administered 2022-09-04: 2 g via INTRAVENOUS
  Filled 2022-09-04: qty 100

## 2022-09-04 MED ORDER — MENTHOL 3 MG MT LOZG: 1.0000 | LOZENGE | OROMUCOSAL | Status: DC | PRN

## 2022-09-04 MED ORDER — SENNA 8.6 MG PO TABS
1.0000 | ORAL_TABLET | Freq: Two times a day (BID) | ORAL | Status: DC
  Administered 2022-09-04: 8.6 mg via ORAL
  Filled 2022-09-04 (×2): qty 1

## 2022-09-04 MED ORDER — KETOROLAC TROMETHAMINE 30 MG/ML IJ SOLN
INTRAMUSCULAR | Status: DC | PRN
  Administered 2022-09-04: 30 mg

## 2022-09-04 MED ORDER — PHENYLEPHRINE HCL-NACL 20-0.9 MG/250ML-% IV SOLN
INTRAVENOUS | Status: DC | PRN
  Administered 2022-09-04: 40 ug/min via INTRAVENOUS

## 2022-09-04 MED ORDER — HYDROMORPHONE HCL 1 MG/ML IJ SOLN: 0.5000 mg | INTRAMUSCULAR | Status: DC | PRN

## 2022-09-04 MED ORDER — PROPOFOL 500 MG/50ML IV EMUL
INTRAVENOUS | Status: DC | PRN
  Administered 2022-09-04: 100 ug/kg/min via INTRAVENOUS

## 2022-09-04 MED ORDER — PANTOPRAZOLE SODIUM 40 MG PO TBEC
80.0000 mg | DELAYED_RELEASE_TABLET | Freq: Every day | ORAL | Status: DC
  Administered 2022-09-05: 80 mg via ORAL
  Filled 2022-09-04: qty 2

## 2022-09-04 MED ORDER — DIPHENHYDRAMINE HCL 12.5 MG/5ML PO ELIX
12.5000 mg | ORAL_SOLUTION | ORAL | Status: DC | PRN
  Administered 2022-09-05: 12.5 mg via ORAL
  Filled 2022-09-04: qty 10

## 2022-09-04 MED ORDER — OXYCODONE HCL 5 MG PO TABS
5.0000 mg | ORAL_TABLET | ORAL | Status: DC | PRN
  Administered 2022-09-04 – 2022-09-05 (×4): 10 mg via ORAL
  Filled 2022-09-04 (×5): qty 2

## 2022-09-04 MED ORDER — ALUM & MAG HYDROXIDE-SIMETH 200-200-20 MG/5ML PO SUSP: 30.0000 mL | ORAL | Status: DC | PRN

## 2022-09-04 MED ORDER — ISOPROPYL ALCOHOL 70 % SOLN
Status: AC
  Filled 2022-09-04: qty 480

## 2022-09-04 MED ORDER — OXYCODONE HCL 5 MG PO TABS
10.0000 mg | ORAL_TABLET | ORAL | Status: DC | PRN
  Administered 2022-09-04: 10 mg via ORAL
  Administered 2022-09-05: 15 mg via ORAL
  Filled 2022-09-04: qty 3

## 2022-09-04 MED ORDER — ISOPROPYL ALCOHOL 70 % SOLN
Status: DC | PRN
  Administered 2022-09-04: 1 via TOPICAL

## 2022-09-04 MED ORDER — SODIUM CHLORIDE (PF) 0.9 % IJ SOLN
INTRAMUSCULAR | Status: DC | PRN
  Administered 2022-09-04: 30 mL

## 2022-09-04 MED ORDER — ACETAMINOPHEN 10 MG/ML IV SOLN: 1000.0000 mg | Freq: Once | INTRAVENOUS | Status: DC | PRN

## 2022-09-04 MED ORDER — FENTANYL CITRATE (PF) 250 MCG/5ML IJ SOLN
INTRAMUSCULAR | Status: DC | PRN
  Administered 2022-09-04 (×2): 50 ug via INTRAVENOUS

## 2022-09-04 MED ORDER — ROSUVASTATIN CALCIUM 10 MG PO TABS
10.0000 mg | ORAL_TABLET | Freq: Every day | ORAL | Status: DC
  Administered 2022-09-05: 10 mg via ORAL
  Filled 2022-09-04: qty 1

## 2022-09-04 MED ORDER — METHOCARBAMOL 500 MG PO TABS
500.0000 mg | ORAL_TABLET | Freq: Four times a day (QID) | ORAL | Status: DC | PRN
  Administered 2022-09-04 – 2022-09-05 (×4): 500 mg via ORAL
  Filled 2022-09-04 (×4): qty 1

## 2022-09-04 MED ORDER — ORAL CARE MOUTH RINSE: 15.0000 mL | OROMUCOSAL | Status: DC | PRN

## 2022-09-04 MED ORDER — PROPOFOL 1000 MG/100ML IV EMUL
INTRAVENOUS | Status: AC
  Filled 2022-09-04: qty 100

## 2022-09-04 MED ORDER — PHENYLEPHRINE 80 MCG/ML (10ML) SYRINGE FOR IV PUSH (FOR BLOOD PRESSURE SUPPORT)
PREFILLED_SYRINGE | INTRAVENOUS | Status: AC
  Filled 2022-09-04: qty 10

## 2022-09-04 MED ORDER — STERILE WATER FOR IRRIGATION IR SOLN
Status: DC | PRN
  Administered 2022-09-04: 2000 mL

## 2022-09-04 MED ORDER — METOCLOPRAMIDE HCL 5 MG PO TABS: 5.0000 mg | ORAL_TABLET | Freq: Three times a day (TID) | ORAL | Status: DC | PRN

## 2022-09-04 MED ORDER — ONDANSETRON HCL 4 MG/2ML IJ SOLN
INTRAMUSCULAR | Status: DC | PRN
  Administered 2022-09-04: 4 mg via INTRAVENOUS

## 2022-09-04 MED ORDER — ONDANSETRON HCL 4 MG/2ML IJ SOLN
INTRAMUSCULAR | Status: AC
  Filled 2022-09-04: qty 2

## 2022-09-04 MED ORDER — MAGNESIUM 200 MG PO TABS: ORAL_TABLET | Freq: Every day | ORAL | Status: DC | PRN

## 2022-09-04 MED ORDER — CHLORHEXIDINE GLUCONATE 0.12 % MT SOLN
15.0000 mL | Freq: Once | OROMUCOSAL | Status: AC
  Administered 2022-09-04: 15 mL via OROMUCOSAL

## 2022-09-04 MED ORDER — NITROGLYCERIN 0.4 MG SL SUBL: 0.4000 mg | SUBLINGUAL_TABLET | SUBLINGUAL | Status: DC | PRN

## 2022-09-04 MED ORDER — LIDOCAINE HCL (PF) 2 % IJ SOLN
INTRAMUSCULAR | Status: AC
  Filled 2022-09-04: qty 5

## 2022-09-04 MED ORDER — VENLAFAXINE HCL ER 150 MG PO CP24
150.0000 mg | ORAL_CAPSULE | Freq: Every day | ORAL | Status: DC
  Administered 2022-09-05: 150 mg via ORAL
  Filled 2022-09-04: qty 1

## 2022-09-04 SURGICAL SUPPLY — 73 items
ADH SKN CLS APL DERMABOND .7 (GAUZE/BANDAGES/DRESSINGS) ×2
APL PRP STRL LF DISP 70% ISPRP (MISCELLANEOUS) ×2
BAG COUNTER SPONGE SURGICOUNT (BAG) IMPLANT
BAG SPEC THK2 15X12 ZIP CLS (MISCELLANEOUS)
BAG SPNG CNTER NS LX DISP (BAG)
BAG ZIPLOCK 12X15 (MISCELLANEOUS) IMPLANT
BATTERY INSTRU NAVIGATION (MISCELLANEOUS) ×3 IMPLANT
BLADE SAW RECIPROCATING 77.5 (BLADE) ×1 IMPLANT
BNDG CMPR STD VLCR NS LF 5.8X4 (GAUZE/BANDAGES/DRESSINGS) ×1
BNDG ELASTIC 4X5.8 VLCR NS LF (GAUZE/BANDAGES/DRESSINGS) IMPLANT
BNDG ELASTIC 4X5.8 VLCR STR LF (GAUZE/BANDAGES/DRESSINGS) ×1 IMPLANT
BNDG ELASTIC 6X5.8 VLCR STR LF (GAUZE/BANDAGES/DRESSINGS) ×1 IMPLANT
BTRY SRG DRVR LF (MISCELLANEOUS) ×3
CHLORAPREP W/TINT 26 (MISCELLANEOUS) ×2 IMPLANT
COMP FEM KNEE PS STD 6 LT (Joint) ×1 IMPLANT
COMPONENT FEM KNEE PS STD 6 LT (Joint) IMPLANT
COVER SURGICAL LIGHT HANDLE (MISCELLANEOUS) ×1 IMPLANT
DERMABOND ADVANCED .7 DNX12 (GAUZE/BANDAGES/DRESSINGS) ×2 IMPLANT
DRAPE INCISE IOBAN 66X45 STRL (DRAPES) ×1 IMPLANT
DRAPE SHEET LG 3/4 BI-LAMINATE (DRAPES) ×3 IMPLANT
DRAPE U-SHAPE 47X51 STRL (DRAPES) ×1 IMPLANT
DRESSING AQUACEL AG SP 3.5X10 (GAUZE/BANDAGES/DRESSINGS) IMPLANT
DRSG AQUACEL AG ADV 3.5X10 (GAUZE/BANDAGES/DRESSINGS) ×1 IMPLANT
DRSG AQUACEL AG SP 3.5X10 (GAUZE/BANDAGES/DRESSINGS) ×1
ELECT BLADE TIP CTD 4 INCH (ELECTRODE) ×1 IMPLANT
ELECT REM PT RETURN 15FT ADLT (MISCELLANEOUS) ×1 IMPLANT
GAUZE SPONGE 4X4 12PLY STRL (GAUZE/BANDAGES/DRESSINGS) ×1 IMPLANT
GLOVE BIO SURGEON STRL SZ7 (GLOVE) ×1 IMPLANT
GLOVE BIO SURGEON STRL SZ8.5 (GLOVE) ×2 IMPLANT
GLOVE BIOGEL PI IND STRL 7.5 (GLOVE) ×1 IMPLANT
GLOVE BIOGEL PI IND STRL 8.5 (GLOVE) ×1 IMPLANT
GOWN SPEC L3 XXLG W/TWL (GOWN DISPOSABLE) ×1 IMPLANT
GOWN STRL REUS W/ TWL XL LVL3 (GOWN DISPOSABLE) ×1 IMPLANT
GOWN STRL REUS W/TWL XL LVL3 (GOWN DISPOSABLE) ×1
HANDPIECE INTERPULSE COAX TIP (DISPOSABLE) ×1
HDLS TROCR DRIL PIN KNEE 75 (PIN) ×2
HOLDER FOLEY CATH W/STRAP (MISCELLANEOUS) ×1 IMPLANT
HOOD PEEL AWAY T7 (MISCELLANEOUS) ×3 IMPLANT
IMPL PATELLA METAL SZ32X10 (Joint) IMPLANT
INSERT ARTISURF SZ 6-7 LT (Insert) IMPLANT
KIT TURNOVER KIT A (KITS) IMPLANT
MARKER SKIN DUAL TIP RULER LAB (MISCELLANEOUS) ×1 IMPLANT
NDL SAFETY ECLIP 18X1.5 (MISCELLANEOUS) ×1 IMPLANT
NDL SPNL 18GX3.5 QUINCKE PK (NEEDLE) ×1 IMPLANT
NEEDLE SPNL 18GX3.5 QUINCKE PK (NEEDLE) ×1 IMPLANT
NS IRRIG 1000ML POUR BTL (IV SOLUTION) ×1 IMPLANT
PACK TOTAL KNEE CUSTOM (KITS) ×1 IMPLANT
PADDING CAST COTTON 6X4 STRL (CAST SUPPLIES) ×1 IMPLANT
PIN DRILL HDLS TROCAR 75 4PK (PIN) IMPLANT
PROTECTOR NERVE ULNAR (MISCELLANEOUS) ×1 IMPLANT
SAW OSC TIP CART 19.5X105X1.3 (SAW) ×1 IMPLANT
SCREW FEMALE HEX FIX 25X2.5 (ORTHOPEDIC DISPOSABLE SUPPLIES) IMPLANT
SEALER BIPOLAR AQUA 6.0 (INSTRUMENTS) ×1 IMPLANT
SET HNDPC FAN SPRY TIP SCT (DISPOSABLE) ×1 IMPLANT
SET PAD KNEE POSITIONER (MISCELLANEOUS) ×1 IMPLANT
SOLUTION PRONTOSAN WOUND 350ML (IRRIGATION / IRRIGATOR) IMPLANT
SPIKE FLUID TRANSFER (MISCELLANEOUS) ×2 IMPLANT
STAPLER VISISTAT 35W (STAPLE) IMPLANT
STEM TIB PS KNEE D 0D LT (Stem) IMPLANT
SUT MNCRL AB 3-0 PS2 18 (SUTURE) ×1 IMPLANT
SUT MNCRL AB 4-0 PS2 18 (SUTURE) IMPLANT
SUT MON AB 2-0 CT1 36 (SUTURE) ×1 IMPLANT
SUT STRATAFIX PDO 1 14 VIOLET (SUTURE) ×1
SUT STRATFX PDO 1 14 VIOLET (SUTURE) ×1
SUT VIC AB 1 CTX 36 (SUTURE) ×2
SUT VIC AB 1 CTX36XBRD ANBCTR (SUTURE) ×2 IMPLANT
SUT VIC AB 2-0 CT1 27 (SUTURE)
SUT VIC AB 2-0 CT1 TAPERPNT 27 (SUTURE) IMPLANT
SUTURE STRATFX PDO 1 14 VIOLET (SUTURE) ×1 IMPLANT
TRAY FOLEY MTR SLVR 16FR STAT (SET/KITS/TRAYS/PACK) IMPLANT
TUBE SUCTION HIGH CAP CLEAR NV (SUCTIONS) ×1 IMPLANT
WATER STERILE IRR 1000ML POUR (IV SOLUTION) ×2 IMPLANT
WRAP KNEE MAXI GEL POST OP (GAUZE/BANDAGES/DRESSINGS) IMPLANT

## 2022-09-04 NOTE — Anesthesia Procedure Notes (Signed)
Anesthesia Regional Block: Adductor canal block   Pre-Anesthetic Checklist: , timeout performed,  Correct Patient, Correct Site, Correct Laterality,  Correct Procedure, Correct Position, site marked,  Risks and benefits discussed,  Surgical consent,  Pre-op evaluation,  At surgeon's request and post-op pain management  Laterality: Lower and Left  Prep: chloraprep       Needles:  Injection technique: Single-shot  Needle Type: Echogenic Needle     Needle Length: 9cm  Needle Gauge: 22     Additional Needles:   Procedures:,,,, ultrasound used (permanent image in chart),,    Narrative:  Start time: 09/04/2022 8:06 AM End time: 09/04/2022 8:11 AM Injection made incrementally with aspirations every 5 mL.  Performed by: Personally  Anesthesiologist: Barnet Glasgow, MD  Additional Notes: Block assessed prior to surgery. Pt tolerated procedure well.

## 2022-09-04 NOTE — Discharge Instructions (Signed)
 Dr. Jorja Empie [facility] [address] [place], [place] [address] [phone]  TOTAL KNEE REPLACEMENT POSTOPERATIVE DIRECTIONS    Knee Rehabilitation, Guidelines Following Surgery  Results after knee surgery are often greatly improved when you follow the exercise, range of motion and muscle strengthening exercises prescribed by your doctor. Safety measures are also important to protect the knee from further injury. Any time any of these exercises cause you to have increased pain or swelling in your knee joint, decrease the amount until you are comfortable again and slowly increase them. If you have problems or questions, call your caregiver or physical therapist for advice.   WEIGHT BEARING Weight bearing as tolerated with assist device (walker, cane, etc) as directed, use it as long as suggested by your surgeon or therapist, typically at least 4-6 weeks.  HOME CARE INSTRUCTIONS  Remove items at home which could result in a fall. This includes throw rugs or furniture in walking pathways.  Continue medications as instructed at time of discharge. You may have some home medications which will be placed on hold until you complete the course of blood thinner medication.  You may start showering once you are discharged home but do not submerge the incision under water. Just pat the incision dry and apply a dry gauze dressing on daily. Walk with walker as instructed.  You may resume a sexual relationship in one month or when given the OK by your doctor.  Use walker as long as suggested by your caregivers. Avoid periods of inactivity such as sitting longer than an hour when not asleep. This helps prevent blood clots.  You may put full weight on your legs and walk as much as is comfortable.  You may return to work once you are cleared by your doctor.  Do not drive a car for 6 weeks or until released by you surgeon.  Do not drive while  taking narcotics.  Wear the elastic stockings for three weeks following surgery during the day but you may remove then at night. Make sure you keep all of your appointments after your operation with all of your doctors and caregivers. You should call the office at the above phone number and make an appointment for approximately two weeks after the date of your surgery. Do not remove your surgical dressing. The dressing is waterproof; you may take showers in 3 days, but do not take tub baths or submerge the dressing. Please pick up a stool softener and laxative for home use as long as you are requiring pain medications. ICE to the affected knee every three hours for 30 minutes at a time and then as needed for pain and swelling.  Continue to use ice on the knee for pain and swelling from surgery. You may notice swelling that will progress down to the foot and ankle.  This is normal after surgery.  Elevate the leg when you are not up walking on it.   It is important for you to complete the blood thinner medication as prescribed by your doctor. Continue to use the breathing machine which will help keep your temperature down.  It is common for your temperature to cycle up and down following surgery, especially at night when you are not up moving around and exerting yourself.  The breathing machine keeps your lungs expanded and your temperature down.  RANGE OF MOTION AND STRENGTHENING EXERCISES  Rehabilitation of the knee is important following a knee injury or an   operation. After just a few days of immobilization, the muscles of the thigh which control the knee become weakened and shrink (atrophy). Knee exercises are designed to build up the tone and strength of the thigh muscles and to improve knee motion. Often times heat used for twenty to thirty minutes before working out will loosen up your tissues and help with improving the range of motion but do not use heat for the first two weeks following surgery.  These exercises can be done on a training (exercise) mat, on the floor, on a table or on a bed. Use what ever works the best and is most comfortable for you Knee exercises include:  Leg Lifts - While your knee is still immobilized in a splint or cast, you can do straight leg raises. Lift the leg to 60 degrees, hold for 3 sec, and slowly lower the leg. Repeat 10-20 times 2-3 times daily. Perform this exercise against resistance later as your knee gets better.  Quad and Hamstring Sets - Tighten up the muscle on the front of the thigh (Quad) and hold for 5-10 sec. Repeat this 10-20 times hourly. Hamstring sets are done by pushing the foot backward against an object and holding for 5-10 sec. Repeat as with quad sets.  A rehabilitation program following serious knee injuries can speed recovery and prevent re-injury in the future due to weakened muscles. Contact your doctor or a physical therapist for more information on knee rehabilitation.   POST-OPERATIVE OPIOID TAPER INSTRUCTIONS: It is important to wean off of your opioid medication as soon as possible. If you do not need pain medication after your surgery it is ok to stop day one. Opioids include: Codeine, Hydrocodone(Norco, Vicodin), Oxycodone(Percocet, oxycontin) and hydromorphone amongst others.  Long term and even short term use of opiods can cause: Increased pain response Dependence Constipation Depression Respiratory depression And more.  Withdrawal symptoms can include Flu like symptoms Nausea, vomiting And more Techniques to manage these symptoms Hydrate well Eat regular healthy meals Stay active Use relaxation techniques(deep breathing, meditating, yoga) Do Not substitute Alcohol to help with tapering If you have been on opioids for less than two weeks and do not have pain than it is ok to stop all together.  Plan to wean off of opioids This plan should start within one week post op of your joint replacement. Maintain the same  interval or time between taking each dose and first decrease the dose.  Cut the total daily intake of opioids by one tablet each day Next start to increase the time between doses. The last dose that should be eliminated is the evening dose.    SKILLED REHAB INSTRUCTIONS: If the patient is transferred to a skilled rehab facility following release from the hospital, a list of the current medications will be sent to the facility for the patient to continue.  When discharged from the skilled rehab facility, please have the facility set up the patient's Home Health Physical Therapy prior to being released. Also, the skilled facility will be responsible for providing the patient with their medications at time of release from the facility to include their pain medication, the muscle relaxants, and their blood thinner medication. If the patient is still at the rehab facility at time of the two week follow up appointment, the skilled rehab facility will also need to assist the patient in arranging follow up appointment in our office and any transportation needs.  MAKE SURE YOU:  Understand these instructions.  Will watch   your condition.  Will get help right away if you are not doing well or get worse.    Pick up stool softner and laxative for home use following surgery while on pain medications. Do NOT remove your dressing. You may shower.  Do not take tub baths or submerge incision under water. May shower starting three days after surgery. Please use a clean towel to pat the incision dry following showers. Continue to use ice for pain and swelling after surgery. Do not use any lotions or creams on the incision until instructed by your surgeon.  

## 2022-09-04 NOTE — Transfer of Care (Signed)
Immediate Anesthesia Transfer of Care Note  Patient: Dorothy Mcdonald  Procedure(s) Performed: COMPUTER ASSISTED TOTAL KNEE ARTHROPLASTY (Left: Knee)  Patient Location: PACU  Anesthesia Type:MAC and Spinal  Level of Consciousness: awake, drowsy, and patient cooperative  Airway & Oxygen Therapy: Patient Spontanous Breathing and Patient connected to face mask oxygen  Post-op Assessment: Report given to RN and Post -op Vital signs reviewed and stable  Post vital signs: Reviewed  Last Vitals:  Vitals Value Taken Time  BP 110/56 09/04/22 1054  Temp    Pulse 64 09/04/22 1057  Resp 17 09/04/22 1057  SpO2 98 % 09/04/22 1057  Vitals shown include unvalidated device data.  Last Pain:  Vitals:   09/04/22 0656  TempSrc: Oral  PainSc:          Complications: No notable events documented.

## 2022-09-04 NOTE — Op Note (Signed)
OPERATIVE REPORT  SURGEON: Rod Can, MD   ASSISTANT: Larene Pickett, PA-C  PREOPERATIVE DIAGNOSIS: Primary Left knee arthritis.   POSTOPERATIVE DIAGNOSIS: Primary Left knee arthritis.   PROCEDURE: Computer assisted Left total knee arthroplasty.   IMPLANTS: Zimmer Persona PPS Cementless CR femur, size 6. Persona 0 degree Spiked Keel OsseoTi Tibia, size D. Vivacit-E polyethelyene insert, size 10 mm, MC. TM standard patella, size 32 mm.  ANESTHESIA:  MAC, Regional, and Spinal  TOURNIQUET TIME: Not utilized.   ESTIMATED BLOOD LOSS:-150 mL    ANTIBIOTICS: 2 g Ancef.  DRAINS: None.  COMPLICATIONS: None   CONDITION: PACU - hemodynamically stable.   BRIEF CLINICAL NOTE: Dorothy Mcdonald is a 77 y.o. female with a long-standing history of Left knee arthritis. After failing conservative management, the patient was indicated for total knee arthroplasty. The risks, benefits, and alternatives to the procedure were explained, and the patient elected to proceed.  PROCEDURE IN DETAIL: Adductor canal block was obtained in the pre-op holding area. Once inside the operative room, spinal anesthesia was obtained, and a foley catheter was inserted. The patient was then positioned and the lower extremity was prepped and draped in the normal sterile surgical fashion.  A time-out was called verifying side and site of surgery. The patient received IV antibiotics within 60 minutes of beginning the procedure. A tourniquet was not utilized.   An anterior approach to the knee was performed utilizing a midvastus arthrotomy. A medial release was performed and the patellar fat pad was excised. Stryker imageless navigation was used to cut the distal femur perpendicular to the mechanical axis. A freehand patellar resection was performed, and the patella was sized an prepared with 3 lug holes.  Nagivation was used to make a neutral proximal tibia resection, taking 4 mm of bone from the less affected lateral side  with 3 degrees of slope. The menisci were excised. A spacer block was placed, and the alignment and balance in extension were confirmed.   The distal femur was sized using the 3-degree external rotation guide referencing the posterior femoral cortex. The appropriate 4-in-1 cutting block was pinned into place. Rotation was checked using Whiteside's line, the epicondylar axis, and then confirmed with a spacer block in flexion. The remaining femoral cuts were performed, taking care to protect the MCL.  The tibia was sized and the trial tray was pinned into place. The remaining trail components were inserted. The knee was stable to varus and valgus stress through a full range of motion. The patella tracked centrally, and the PCL was well balanced. The trial components were removed, and the proximal tibial surface was prepared. Final components were impacted into place. The knee was tested for a final time and found to be well balanced.   The wound was copiously irrigated with Prontosan solution and normal saline using pule lavage.  Marcaine solution was injected into the periarticular soft tissue.  The wound was closed in layers using #1 Vicryl and Stratafix for the fascia, 2-0 Vicryl for the subcutaneous fat, 2-0 Monocryl for the deep dermal layer, and staples + Dermabond for the skin.  Once the glue was fully dried, an Aquacell Ag and compressive dressing were applied.  The patient was transported to the recovery room in stable condition.  Sponge, needle, and instrument counts were correct at the end of the case x2.  The patient tolerated the procedure well and there were no known complications.  Please note that a surgical assistant was a medical necessity for this procedure in  order to perform it in a safe and expeditious manner. Surgical assistant was necessary to retract the ligaments and vital neurovascular structures to prevent injury to them and also necessary for proper positioning of the limb to allow  for anatomic placement of the prosthesis.

## 2022-09-04 NOTE — Evaluation (Signed)
Physical Therapy Evaluation Patient Details Name: Dorothy Mcdonald MRN: [id number] DOB: 1946/08/16 Today's Date: 09/04/2022  History of Present Illness  Pt is a 77yo female presenting s/p L-TKA on 09/04/22. PMH: Afib, CKD< anxiety & depression, GERD, HLD, IBS,  Clinical Impression  Dorothy Mcdonald is a 77 y.o. female POD 0 s/p L-TKA. Patient reports independence with mobility at baseline and does endorse recent fall secondary to tripping while wearing flipflops but denies hitting her head. Patient is now limited by functional impairments (see PT problem list below) and requires min guard for bed mobility and for transfers. Patient was able to ambulate 16 feet with RW and min guard level of assist. Patient instructed in exercise to facilitate ROM and circulation to manage edema. Provided incentive spirometer and with Vcs pt able to achieve 1226m. Patient will benefit from continued skilled PT interventions to address impairments and progress towards PLOF. Acute PT will follow to progress mobility and stair training in preparation for safe discharge home.       Recommendations for follow up therapy are one component of a multi-disciplinary discharge planning process, led by the attending physician.  Recommendations may be updated based on patient status, additional functional criteria and insurance authorization.  Follow Up Recommendations Follow physician's recommendations for discharge plan and follow up therapies      Assistance Recommended at Discharge Frequent or constant Supervision/Assistance  Patient can return home with the following  A little help with walking and/or transfers;A lot of help with bathing/dressing/bathroom;Assistance with cooking/housework;Assist for transportation;Help with stairs or ramp for entrance    Equipment Recommendations None recommended by PT (Pt has recommended DME)  Recommendations for Other Services       Functional Status Assessment Patient has had a recent  decline in their functional status and demonstrates the ability to make significant improvements in function in a reasonable and predictable amount of time.     Precautions / Restrictions Precautions Precautions: Fall;Knee Precaution Booklet Issued: No Precaution Comments: no pillow under the knee Restrictions Weight Bearing Restrictions: No Other Position/Activity Restrictions: wbat      Mobility  Bed Mobility Overal bed mobility: Needs Assistance Bed Mobility: Supine to Sit     Supine to sit: Min guard, HOB elevated     General bed mobility comments: For safety only, no physical assist required, pt utilized bed rails.    Transfers Overall transfer level: Needs assistance Equipment used: Rolling walker (2 wheels) Transfers: Sit to/from Stand Sit to Stand: Min guard, From elevated surface           General transfer comment: for safety only, VCs for hand placement and powering up using RLE and BUE    Ambulation/Gait Ambulation/Gait assistance: Min guard Gait Distance (Feet): 16 Feet Assistive device: Rolling walker (2 wheels) Gait Pattern/deviations: Step-to pattern Gait velocity: decreased     General Gait Details: Pt ambulated with RW and min guard, no physical assist required or overt LOB noted, VCs for sequencing.  Stairs            Wheelchair Mobility    Modified Rankin (Stroke Patients Only)       Balance Overall balance assessment: Needs assistance Sitting-balance support: Feet supported, No upper extremity supported Sitting balance-Leahy Scale: Good     Standing balance support: Reliant on assistive device for balance, During functional activity, Bilateral upper extremity supported Standing balance-Leahy Scale: Poor  Pertinent Vitals/Pain Pain Assessment Pain Assessment: No/denies pain Pain Score: 0-No pain    Home Living Family/patient expects to be discharged to:: Private residence Living  Arrangements: Spouse/significant other Available Help at Discharge: Family;Available 24 hours/day Type of Home: House Home Access: Stairs to enter Entrance Stairs-Rails: None Entrance Stairs-Number of Steps: 3   Home Layout: One level Home Equipment: Conservation officer, nature (2 wheels);Cane - single point      Prior Function Prior Level of Function : Independent/Modified Independent;History of Falls (last six months);Driving             Mobility Comments: IND. Fall: wearing flip-flops, denies hitting head. ADLs Comments: IND     Hand Dominance        Extremity/Trunk Assessment   Upper Extremity Assessment Upper Extremity Assessment: Overall WFL for tasks assessed    Lower Extremity Assessment Lower Extremity Assessment: RLE deficits/detail;LLE deficits/detail RLE Deficits / Details: MMT ank DF/PF 5/5 RLE Sensation: WNL LLE Deficits / Details: MMT ank DF/PF 5/5, no extensor lag noted LLE Sensation: decreased light touch    Cervical / Trunk Assessment Cervical / Trunk Assessment: Normal  Communication   Communication: No difficulties  Cognition Arousal/Alertness: Awake/alert Behavior During Therapy: WFL for tasks assessed/performed Overall Cognitive Status: Within Functional Limits for tasks assessed                                          General Comments General comments (skin integrity, edema, etc.): Daughter and husband present    Exercises Total Joint Exercises Ankle Circles/Pumps: AROM, Both, 20 reps   Assessment/Plan    PT Assessment Patient needs continued PT services  PT Problem List Decreased strength;Decreased range of motion;Decreased activity tolerance;Decreased balance;Decreased mobility;Pain       PT Treatment Interventions DME instruction;Gait training;Stair training;Functional mobility training;Therapeutic activities;Therapeutic exercise;Balance training;Neuromuscular re-education;Patient/family education    PT Goals (Current  goals can be found in the Care Plan section)  Acute Rehab PT Goals Patient Stated Goal: Walking without pain PT Goal Formulation: With patient Time For Goal Achievement: 09/11/22 Potential to Achieve Goals: Good    Frequency 7X/week     Co-evaluation               AM-PAC PT "6 Clicks" Mobility  Outcome Measure Help needed turning from your back to your side while in a flat bed without using bedrails?: None Help needed moving from lying on your back to sitting on the side of a flat bed without using bedrails?: A Little Help needed moving to and from a bed to a chair (including a wheelchair)?: A Little Help needed standing up from a chair using your arms (e.g., wheelchair or bedside chair)?: A Little Help needed to walk in hospital room?: A Little Help needed climbing 3-5 steps with a railing? : A Little 6 Click Score: 19    End of Session Equipment Utilized During Treatment: Gait belt Activity Tolerance: Patient tolerated treatment well;No increased pain Patient left: in chair;with call bell/phone within reach;with chair alarm set;with family/visitor present;with SCD's reapplied Nurse Communication: Mobility status PT Visit Diagnosis: Pain;Difficulty in walking, not elsewhere classified (R26.2) Pain - Right/Left: Left Pain - part of body: Knee    Time: 5409-8119 PT Time Calculation (min) (ACUTE ONLY): 25 min   Charges:   PT Evaluation $PT Eval Low Complexity: 1 Low PT Treatments $Gait Training: 8-22 mins  Coolidge Breeze, PT, DPT [facility] Office: [phone] Weekend pager: [phone]  Coolidge Breeze 09/04/2022, 6:30 PM

## 2022-09-04 NOTE — Anesthesia Postprocedure Evaluation (Signed)
Anesthesia Post Note  Patient: Dorothy Mcdonald  Procedure(s) Performed: COMPUTER ASSISTED TOTAL KNEE ARTHROPLASTY (Left: Knee)     Patient location during evaluation: Nursing Unit Anesthesia Type: Regional and Spinal Level of consciousness: oriented and awake and alert Pain management: pain level controlled Vital Signs Assessment: post-procedure vital signs reviewed and stable Respiratory status: spontaneous breathing and respiratory function stable Cardiovascular status: blood pressure returned to baseline and stable Postop Assessment: no headache, no backache, no apparent nausea or vomiting and patient able to bend at knees Anesthetic complications: no  No notable events documented.  Last Vitals:  Vitals:   09/04/22 1145 09/04/22 1200  BP: 122/69 92/76  Pulse: 73 79  Resp: 14 14  Temp: 36.7 C   SpO2: 97% 97%    Last Pain:  Vitals:   09/04/22 1200  TempSrc:   PainSc: [name]

## 2022-09-04 NOTE — Interval H&P Note (Signed)
History and Physical Interval Note:  09/04/2022 7:43 AM  Dorothy Mcdonald  has presented today for surgery, with the diagnosis of Left knee osteoarthritis.  The various methods of treatment have been discussed with the patient and family. After consideration of risks, benefits and other options for treatment, the patient has consented to  Procedure(s) with comments: [place] (Left) - 150 as a surgical intervention.  The patient's history has been reviewed, patient examined, no change in status, stable for surgery.  I have reviewed the patient's chart and labs.  Questions were answered to the patient's satisfaction.     Hilton Cork Angello Chien

## 2022-09-04 NOTE — Anesthesia Procedure Notes (Signed)
Procedure Name: MAC Date/Time: 09/04/2022 8:30 AM  Performed by: Jenne Campus, CRNAPre-anesthesia Checklist: Patient identified, Emergency Drugs available and Suction available Oxygen Delivery Method: Simple face mask Placement Confirmation: positive ETCO2

## 2022-09-04 NOTE — Anesthesia Procedure Notes (Signed)
Spinal  Patient location during procedure: OR Start time: 09/04/2022 8:24 AM End time: 09/04/2022 8:29 AM Reason for block: surgical anesthesia Staffing Performed: anesthesiologist  Anesthesiologist: Barnet Glasgow, MD Performed by: Barnet Glasgow, MD Authorized by: Barnet Glasgow, MD   Preanesthetic Checklist Completed: patient identified, IV checked, site marked, risks and benefits discussed, surgical consent, monitors and equipment checked, pre-op evaluation and timeout performed Spinal Block Patient position: sitting Prep: DuraPrep and site prepped and draped Patient monitoring: heart rate, cardiac monitor, continuous pulse ox and blood pressure Approach: right paramedian Location: L3-4 Injection technique: single-shot Needle Needle type: Sprotte  Needle gauge: 24 G Needle length: 9 cm Needle insertion depth: 7 cm Assessment Sensory level: T4 Events: CSF return Additional Notes  3 Attempt (s) 1 attempt at L3-4 midline, 2nd Attempt L 2-3 midline 3rd Attempt at L3-4 paramedian . Pt tolerated procedure well.

## 2022-09-05 DIAGNOSIS — Z87891 Personal history of nicotine dependence: Secondary | ICD-10-CM | POA: Diagnosis not present

## 2022-09-05 DIAGNOSIS — M1712 Unilateral primary osteoarthritis, left knee: Secondary | ICD-10-CM | POA: Diagnosis not present

## 2022-09-05 DIAGNOSIS — K449 Diaphragmatic hernia without obstruction or gangrene: Secondary | ICD-10-CM | POA: Diagnosis not present

## 2022-09-05 DIAGNOSIS — K219 Gastro-esophageal reflux disease without esophagitis: Secondary | ICD-10-CM | POA: Diagnosis not present

## 2022-09-05 DIAGNOSIS — I4891 Unspecified atrial fibrillation: Secondary | ICD-10-CM | POA: Diagnosis not present

## 2022-09-05 DIAGNOSIS — F32A Depression, unspecified: Secondary | ICD-10-CM | POA: Diagnosis not present

## 2022-09-05 DIAGNOSIS — F418 Other specified anxiety disorders: Secondary | ICD-10-CM | POA: Diagnosis not present

## 2022-09-05 LAB — BASIC METABOLIC PANEL
Anion gap: 9 (ref 5–15)
BUN: 24 mg/dL — ABNORMAL HIGH (ref 8–23)
CO2: 21 mmol/L — ABNORMAL LOW (ref 22–32)
Calcium: 8.4 mg/dL — ABNORMAL LOW (ref 8.9–10.3)
Chloride: 108 mmol/L (ref 98–111)
Creatinine, Ser: 0.93 mg/dL (ref 0.44–1.00)
GFR, Estimated: >60 mL/min (ref >60)
Glucose, Bld: 129 mg/dL — ABNORMAL HIGH (ref 70–99)
Potassium: 4 mmol/L (ref 3.5–5.1)
Sodium: 138 mmol/L (ref 135–145)

## 2022-09-05 LAB — CBC
HCT: 27.7 % — ABNORMAL LOW (ref 36.0–46.0)
Hemoglobin: 9.1 g/dL — ABNORMAL LOW (ref 12.0–15.0)
MCH: 29 pg (ref 26.0–34.0)
MCHC: 32.9 g/dL (ref 30.0–36.0)
MCV: 88.2 fL (ref 80.0–100.0)
Platelets: 214 10*3/uL (ref 150–400)
RBC: 3.14 MIL/uL — ABNORMAL LOW (ref 3.87–5.11)
RDW: 13.8 % (ref 11.5–15.5)
WBC: 15.5 10*3/uL — ABNORMAL HIGH (ref 4.0–10.5)
nRBC: 0 % (ref 0.0–0.2)

## 2022-09-05 LAB — GLUCOSE, CAPILLARY
Glucose-Capillary: 103 mg/dL — ABNORMAL HIGH (ref 70–99)
Glucose-Capillary: 149 mg/dL — ABNORMAL HIGH (ref 70–99)

## 2022-09-05 MED ORDER — POLYETHYLENE GLYCOL 3350 17 G PO PACK: 17.0000 g | PACK | Freq: Every day | ORAL | 0 refills | Status: AC | PRN

## 2022-09-05 MED ORDER — SENNA 8.6 MG PO TABS: 2.0000 | ORAL_TABLET | Freq: Every day | ORAL | 0 refills | Status: AC

## 2022-09-05 MED ORDER — DOCUSATE SODIUM 100 MG PO CAPS: 100.0000 mg | ORAL_CAPSULE | Freq: Two times a day (BID) | ORAL | 0 refills | Status: AC

## 2022-09-05 MED ORDER — OXYCODONE HCL 5 MG PO TABS: 5.0000 mg | ORAL_TABLET | ORAL | 0 refills | Status: DC | PRN

## 2022-09-05 MED ORDER — ONDANSETRON HCL 4 MG PO TABS: 4.0000 mg | ORAL_TABLET | Freq: Three times a day (TID) | ORAL | 0 refills | Status: AC | PRN

## 2022-09-05 NOTE — Discharge Summary (Signed)
Physician Discharge Summary  Patient ID: Dorothy Mcdonald MRN: [id number] DOB/AGE: 1946-01-17 77 y.o.  Admit date: 09/04/2022 Discharge date: 09/05/2022  Admission Diagnoses:  Osteoarthritis of left knee  Discharge Diagnoses:  Principal Problem:   Osteoarthritis of left knee   Past Medical History:  Diagnosis Date   Adenomatous colon polyp 2003   Anxiety    Arthritis    Atrial fibrillation ([place])    Chronic kidney disease    Depression    Dysrhythmia    Family history of adverse reaction to anesthesia    sister- problems waking up after anes. took several days to recover fully   Gallstones    GERD (gastroesophageal reflux disease)    Hepatic steatosis    History of hiatal hernia    Hyperlipidemia    IBS (irritable bowel syndrome)    Internal hemorrhoids    Irregular heart beat    Peptic ulcer disease    Rotator cuff disorder, left    TMJ (temporomandibular joint disorder)    Vitamin D deficiency     Surgeries: Procedure(s): COMPUTER ASSISTED TOTAL KNEE ARTHROPLASTY on 09/04/2022   Consultants (if any):   Discharged Condition: Improved  Hospital Course: Dorothy Mcdonald is an 77 y.o. female who was admitted 09/04/2022 with a diagnosis of Osteoarthritis of left knee and went to the operating room on 09/04/2022 and underwent the above named procedures.    She was given perioperative antibiotics:  Anti-infectives (From admission, onward)    Start     Dose/Rate Route Frequency Ordered Stop   09/04/22 1500  ceFAZolin (ANCEF) IVPB 2g/100 mL premix        2 g 200 mL/hr over 30 Minutes Intravenous Every 6 hours 09/04/22 1439 09/04/22 2209   09/04/22 0630  ceFAZolin (ANCEF) IVPB 2g/100 mL premix        2 g 200 mL/hr over 30 Minutes Intravenous On call to O.R. 09/04/22 4401 09/04/22 0272       She was given sequential compression devices, early ambulation, and Eliquis for DVT prophylaxis.  POD#1 She ambulated 120 ft and 200 ft. She was discharged home with OPPT.   She  benefited maximally from the hospital stay and there were no complications.    Recent vital signs:  Vitals:   09/05/22 0500 09/05/22 0954  BP: (!) 125/53 (!) 110/59  Pulse: 70 70  Resp: 16 18  Temp: 97.9 F (36.6 C) 98.2 F (36.8 C)  SpO2: 96% 97%    Recent laboratory studies:  Lab Results  Component Value Date   HGB 9.1 (L) 09/05/2022   HGB 12.9 08/27/2022   HGB 14.5 12/31/2021   Lab Results  Component Value Date   WBC 15.5 (H) 09/05/2022   PLT 214 09/05/2022   Lab Results  Component Value Date   INR 2.22 08/31/2016   Lab Results  Component Value Date   NA 138 09/05/2022   K 4.0 09/05/2022   CL 108 09/05/2022   CO2 21 (L) 09/05/2022   BUN 24 (H) 09/05/2022   CREATININE 0.93 09/05/2022   GLUCOSE 129 (H) 09/05/2022     Allergies as of 09/05/2022       Reactions   Amoxicillin-pot Clavulanate Nausea And Vomiting   None listed per [facility].   Penicillins Nausea And Vomiting   Patient denies        Medication List     TAKE these medications    apixaban 5 MG Tabs tablet Commonly known as: ELIQUIS Take 5 mg by  mouth 2 (two) times daily.   docusate sodium 100 MG capsule Commonly known as: Colace Take 1 capsule (100 mg total) by mouth 2 (two) times daily.   furosemide 80 MG tablet Commonly known as: LASIX Take 80 mg by mouth daily.   MAGNESIUM PO Take 1 tablet by mouth daily as needed (leg cramps).   metFORMIN 1000 MG tablet Commonly known as: GLUCOPHAGE Take 1,000 mg by mouth 2 (two) times daily with a meal.   metoprolol succinate 25 MG 24 hr tablet Commonly known as: TOPROL-XL Take 1 tablet (25 mg total) by mouth daily. What changed: how much to take   nitroGLYCERIN 0.4 MG SL tablet Commonly known as: NITROSTAT Place 1 tablet (0.4 mg total) under the tongue every 5 (five) minutes as needed for chest pain.   omeprazole 40 MG capsule Commonly known as: PRILOSEC TAKE ONE CAPSULE BY MOUTH EVERY DAY   ondansetron 4 MG  tablet Commonly known as: Zofran Take 1 tablet (4 mg total) by mouth every 8 (eight) hours as needed for nausea or vomiting.   oxyCODONE 5 MG immediate release tablet Commonly known as: Roxicodone Take 1 tablet (5 mg total) by mouth every 4 (four) hours as needed for severe pain.   polyethylene glycol 17 g packet Commonly known as: MiraLax Take 17 g by mouth daily as needed for mild constipation or moderate constipation.   rosuvastatin 10 MG tablet Commonly known as: CRESTOR Take 1 tablet (10 mg total) by mouth daily.   senna 8.6 MG Tabs tablet Commonly known as: SENOKOT Take 2 tablets (17.2 mg total) by mouth at bedtime for 15 days.   venlafaxine XR 150 MG 24 hr capsule Commonly known as: EFFEXOR-XR Take 150 mg by mouth daily.               Discharge Care Instructions  (From admission, onward)           Start     Ordered   09/05/22 0000  Weight bearing as tolerated        09/05/22 0736   09/05/22 0000  Change dressing       Comments: Do not remove your dressing.   09/05/22 0736              WEIGHT BEARING   Weight bearing as tolerated with assist device (walker, cane, etc) as directed, use it as long as suggested by your surgeon or therapist, typically at least 4-6 weeks.   EXERCISES  Results after joint replacement surgery are often greatly improved when you follow the exercise, range of motion and muscle strengthening exercises prescribed by your doctor. Safety measures are also important to protect the joint from further injury. Any time any of these exercises cause you to have increased pain or swelling, decrease what you are doing until you are comfortable again and then slowly increase them. If you have problems or questions, call your caregiver or physical therapist for advice.   Rehabilitation is important following a joint replacement. After just a few days of immobilization, the muscles of the leg can become weakened and shrink (atrophy).   These exercises are designed to build up the tone and strength of the thigh and leg muscles and to improve motion. Often times heat used for twenty to thirty minutes before working out will loosen up your tissues and help with improving the range of motion but do not use heat for the first two weeks following surgery (sometimes heat can increase post-operative swelling).  These exercises can be done on a training (exercise) mat, on the floor, on a table or on a bed. Use whatever works the best and is most comfortable for you.    Use music or television while you are exercising so that the exercises are a pleasant break in your day. This will make your life better with the exercises acting as a break in your routine that you can look forward to.   Perform all exercises about fifteen times, three times per day or as directed.  You should exercise both the operative leg and the other leg as well.  Exercises include:   Quad Sets - Tighten up the muscle on the front of the thigh (Quad) and hold for 5-10 seconds.   Straight Leg Raises - With your knee straight (if you were given a brace, keep it on), lift the leg to 60 degrees, hold for 3 seconds, and slowly lower the leg.  Perform this exercise against resistance later as your leg gets stronger.  Leg Slides: Lying on your back, slowly slide your foot toward your buttocks, bending your knee up off the floor (only go as far as is comfortable). Then slowly slide your foot back down until your leg is flat on the floor again.  Angel Wings: Lying on your back spread your legs to the side as far apart as you can without causing discomfort.  Hamstring Strength:  Lying on your back, push your heel against the floor with your leg straight by tightening up the muscles of your buttocks.  Repeat, but this time bend your knee to a comfortable angle, and push your heel against the floor.  You may put a pillow under the heel to make it more comfortable if necessary.   A  rehabilitation program following joint replacement surgery can speed recovery and prevent re-injury in the future due to weakened muscles. Contact your doctor or a physical therapist for more information on knee rehabilitation.    CONSTIPATION  Constipation is defined medically as fewer than three stools per week and severe constipation as less than one stool per week.  Even if you have a regular bowel pattern at home, your normal regimen is likely to be disrupted due to multiple reasons following surgery.  Combination of anesthesia, postoperative narcotics, change in appetite and fluid intake all can affect your bowels.   YOU MUST use at least one of the following options; they are listed in order of increasing strength to get the job done.  They are all available over the counter, and you may need to use some, POSSIBLY even all of these options:    Drink plenty of fluids (prune juice may be helpful) and high fiber foods Colace 100 mg by mouth twice a day  Senokot for constipation as directed and as needed Dulcolax (bisacodyl), take with full glass of water  Miralax (polyethylene glycol) once or twice a day as needed.  If you have tried all these things and are unable to have a bowel movement in the first 3-4 days after surgery call either your surgeon or your primary doctor.    If you experience loose stools or diarrhea, hold the medications until you stool forms back up.  If your symptoms do not get better within 1 week or if they get worse, check with your doctor.  If you experience "the worst abdominal pain ever" or develop nausea or vomiting, please contact the office immediately for further recommendations for treatment.   ITCHING:  If you experience itching with your medications, try taking only a single pain pill, or even half a pain pill at a time.  You can also use Benadryl over the counter for itching or also to help with sleep.   TED HOSE STOCKINGS:  Use stockings on both legs until  for at least 2 weeks or as directed by physician office. They may be removed at night for sleeping.  MEDICATIONS:  See your medication summary on the "After Visit Summary" that nursing will review with you.  You may have some home medications which will be placed on hold until you complete the course of blood thinner medication.  It is important for you to complete the blood thinner medication as prescribed.  PRECAUTIONS:  If you experience chest pain or shortness of breath - call 911 immediately for transfer to the hospital emergency department.   If you develop a fever greater that 101 F, purulent drainage from wound, increased redness or drainage from wound, foul odor from the wound/dressing, or calf pain - CONTACT YOUR SURGEON.                                                   FOLLOW-UP APPOINTMENTS:  If you do not already have a post-op appointment, please call the office for an appointment to be seen by your surgeon.  Guidelines for how soon to be seen are listed in your "After Visit Summary", but are typically between 1-4 weeks after surgery.  OTHER INSTRUCTIONS:   Knee Replacement:  Do not place pillow under knee, focus on keeping the knee straight while resting. CPM instructions: 0-90 degrees, 2 hours in the morning, 2 hours in the afternoon, and 2 hours in the evening. Place foam block, curve side up under heel at all times except when in CPM or when walking.  DO NOT modify, tear, cut, or change the foam block in any way.   MAKE SURE YOU:  Understand these instructions.  Get help right away if you are not doing well or get worse.    Thank you for letting us be a part of your medical care team.  It is a privilege we respect greatly.  We hope these instructions will help you stay on track for a fast and full recovery!   Diagnostic Studies: DG Knee Left Port  Result Date: 09/04/2022 CLINICAL DATA:  Status post total knee replacement EXAM: PORTABLE LEFT KNEE - 1-2 VIEW COMPARISON:  None  Available. FINDINGS: Post retained is of left total knee arthroplasty. Normal alignment. No evidence of fracture. Expected soft tissue changes including a joint effusion. IMPRESSION: Postsurgical changes of left total knee arthroplasty. No evidence of immediate hardware complication. Electronically Signed   By: Maurine Simmering M.D.   On: 09/04/2022 11:12   MM RT BREAST BX W LOC DEV 1ST LESION IMAGE BX SPEC STEREO GUIDE  Addendum Date: 08/15/2022   ADDENDUM REPORT: 08/15/2022 08:57 ADDENDUM: Pathology revealed FIBROADENOMATOID CHANGE WITH STROMAL CALCIFICATIONS of the RIGHT breast, lower inner quadrant calcifications (X clip). This was found to be concordant by Dr. Nolon Nations. Pathology results were discussed with the patient by telephone. The patient reported doing well after the biopsy with tenderness at the site. Post biopsy instructions and care were reviewed and questions were answered. The patient was encouraged to call [facility]  for any additional concerns. The patient was instructed to return for annual screening mammography due November 2024. Pathology results reported by Stacie Acres RN on 08/15/2022. Electronically Signed   By: Nolon Nations M.D.   On: 08/15/2022 08:57   Result Date: 08/15/2022 CLINICAL DATA:  Patient presents for stereotactic biopsy of RIGHT breast calcifications. EXAM: RIGHT BREAST STEREOTACTIC CORE NEEDLE BIOPSY COMPARISON:  Previous exam(s). FINDINGS: The patient and I discussed the procedure of stereotactic-guided biopsy including benefits and alternatives. We discussed the high likelihood of a successful procedure. We discussed the risks of the procedure including infection, bleeding, tissue injury, clip migration, and inadequate sampling. Informed written consent was given. The usual time out protocol was performed immediately prior to the procedure. Using sterile technique and lidocaine and lidocaine with epinephrine as local anesthetic,  under stereotactic guidance, a 9 gauge vacuum assisted device was used to perform core needle biopsy of calcifications in the LOWER INNER QUADRANT of the RIGHT breast using a MEDIAL approach. Specimen radiograph was performed showing calcifications in numerous tissue samples. Specimens with calcifications are identified for pathology. Lesion quadrant: LOWER INNER QUADRANT RIGHT breast At the conclusion of the procedure, X shaped tissue marker clip was deployed into the biopsy cavity. Follow-up 2-view mammogram was performed and dictated separately. IMPRESSION: Stereotactic-guided biopsy of RIGHT breast calcifications. No apparent complications. Electronically Signed: By: Nolon Nations M.D. On: 08/13/2022 14:57  MM CLIP PLACEMENT RIGHT  Result Date: 08/13/2022 CLINICAL DATA:  Status post stereotactic biopsy of the RIGHT breast. EXAM: 3D DIAGNOSTIC RIGHT MAMMOGRAM POST STEREOTACTIC BIOPSY COMPARISON:  Previous exam(s). FINDINGS: 3D Mammographic images were obtained following stereotactic guided biopsy of calcifications in the LOWER INNER QUADRANT of the RIGHT breast and placement of an X shaped. The biopsy marking clip is in expected position at the site of biopsy. IMPRESSION: Appropriate positioning of the X shaped biopsy marking clip at the site of biopsy in the LOWER INNER QUADRANT RIGHT breast. Final Assessment: Post Procedure Mammograms for Marker Placement Electronically Signed   By: Nolon Nations M.D.   On: 08/13/2022 15:18   Disposition: Discharge disposition: 01-Home or Self Care       Discharge Instructions     Call MD / Call 911   Complete by: As directed    If you experience chest pain or shortness of breath, CALL 911 and be transported to the hospital emergency room.  If you develope a fever above 101 F, pus (white drainage) or increased drainage or redness at the wound, or calf pain, call your surgeon's office.   Change dressing   Complete by: As directed    Do not remove your  dressing.   Constipation Prevention   Complete by: As directed    Drink plenty of fluids.  Prune juice may be helpful.  You may use a stool softener, such as Colace (over the counter) 100 mg twice a day.  Use MiraLax (over the counter) for constipation as needed.   Diet - low sodium heart healthy   Complete by: As directed    Discharge instructions   Complete by: As directed    Elevate toes above nose. Use cryotherapy as needed for pain and swelling.   Do not put a pillow under the knee. Place it under the heel.   Complete by: As directed    Driving restrictions   Complete by: As directed    No driving for 6 weeks   Increase activity slowly as tolerated   Complete by: As directed  Lifting restrictions   Complete by: As directed    No lifting for 6 weeks   Post-operative opioid taper instructions:   Complete by: As directed    POST-OPERATIVE OPIOID TAPER INSTRUCTIONS: It is important to wean off of your opioid medication as soon as possible. If you do not need pain medication after your surgery it is ok to stop day one. Opioids include: Codeine, Hydrocodone(Norco, Vicodin), Oxycodone(Percocet, oxycontin) and hydromorphone amongst others.  Long term and even short term use of opiods can cause: Increased pain response Dependence Constipation Depression Respiratory depression And more.  Withdrawal symptoms can include Flu like symptoms Nausea, vomiting And more Techniques to manage these symptoms Hydrate well Eat regular healthy meals Stay active Use relaxation techniques(deep breathing, meditating, yoga) Do Not substitute Alcohol to help with tapering If you have been on opioids for less than two weeks and do not have pain than it is ok to stop all together.  Plan to wean off of opioids This plan should start within one week post op of your joint replacement. Maintain the same interval or time between taking each dose and first decrease the dose.  Cut the total daily  intake of opioids by one tablet each day Next start to increase the time between doses. The last dose that should be eliminated is the evening dose.      TED hose   Complete by: As directed    Use stockings (TED hose) for 2 weeks on both leg(s).  You may remove them at night for sleeping.   Weight bearing as tolerated   Complete by: As directed         Follow-up Information     Charlott Rakes, PA-C. Schedule an appointment as soon as possible for a visit in 2 week(s).   Specialty: Orthopedic Surgery Why: For wound re-check, For suture removal Contact information: [address], [address] [place] [address] [phone]                  Signed: Charlott Rakes, PA-C 09/05/2022, 4:06 PM

## 2022-09-05 NOTE — Progress Notes (Signed)
Patient discharged to home w/ family. Given all belongings, instructions. Verbalized understanding of instructions. Escorted to pov via w/c. 

## 2022-09-05 NOTE — Progress Notes (Signed)
Physical Therapy Treatment Patient Details Name: Dorothy Mcdonald MRN: [id number] DOB: 05/14/1946 Today's Date: 09/05/2022   History of Present Illness Pt is a 77yo female presenting s/p L-TKA on 09/04/22. PMH: Afib, CKD< anxiety & depression, GERD, HLD, IBS    PT Comments    Pt ambulated in hallway and performed LE exercises.  Pt plans to ambulate again and practice stairs this afternoon prior to d/c home today.    Recommendations for follow up therapy are one component of a multi-disciplinary discharge planning process, led by the attending physician.  Recommendations may be updated based on patient status, additional functional criteria and insurance authorization.  Follow Up Recommendations  Follow physician's recommendations for discharge plan and follow up therapies     Assistance Recommended at Discharge Frequent or constant Supervision/Assistance  Patient can return home with the following A little help with walking and/or transfers;Assistance with cooking/housework;Assist for transportation;Help with stairs or ramp for entrance;A little help with bathing/dressing/bathroom   Equipment Recommendations  None recommended by PT    Recommendations for Other Services       Precautions / Restrictions Precautions Precautions: Fall;Knee Restrictions Other Position/Activity Restrictions: WBAT     Mobility  Bed Mobility Overal bed mobility: Needs Assistance Bed Mobility: Supine to Sit     Supine to sit: Supervision, HOB elevated          Transfers Overall transfer level: Needs assistance Equipment used: Rolling walker (2 wheels) Transfers: Sit to/from Stand Sit to Stand: Min guard           General transfer comment: verbal cues for UE and LE positioning for pain control    Ambulation/Gait Ambulation/Gait assistance: Min guard Gait Distance (Feet): 120 Feet Assistive device: Rolling walker (2 wheels) Gait Pattern/deviations: Step-through pattern, Decreased stride  length, Antalgic Gait velocity: decreased     General Gait Details: verbal cues for sequence, RW positioning, step length, posture   Stairs             Wheelchair Mobility    Modified Rankin (Stroke Patients Only)       Balance                                            Cognition Arousal/Alertness: Awake/alert Behavior During Therapy: WFL for tasks assessed/performed Overall Cognitive Status: Within Functional Limits for tasks assessed                                          Exercises Total Joint Exercises Ankle Circles/Pumps: AROM, Both, 20 reps Quad Sets: AROM, Both, 10 reps Heel Slides: AAROM, Left, 10 reps Hip ABduction/ADduction: AAROM, Left, 10 reps Straight Leg Raises: 10 reps, AAROM, Left    General Comments        Pertinent Vitals/Pain Pain Assessment Pain Assessment: 0-10 Pain Score: 6  Pain Location: left knee Pain Descriptors / Indicators: Aching, Sore Pain Intervention(s): Repositioned, Patient requesting pain meds-RN notified, Monitored during session    Home Living                          Prior Function            PT Goals (current goals can now be found in the care plan section) Progress towards PT goals:  Progressing toward goals    Frequency    7X/week      PT Plan      Co-evaluation              AM-PAC PT "6 Clicks" Mobility   Outcome Measure  Help needed turning from your back to your side while in a flat bed without using bedrails?: None Help needed moving from lying on your back to sitting on the side of a flat bed without using bedrails?: A Little Help needed moving to and from a bed to a chair (including a wheelchair)?: A Little Help needed standing up from a chair using your arms (e.g., wheelchair or bedside chair)?: A Little Help needed to walk in hospital room?: A Little Help needed climbing 3-5 steps with a railing? : A Little 6 Click Score: 19    End  of Session Equipment Utilized During Treatment: Gait belt Activity Tolerance: Patient tolerated treatment well Patient left: in chair;with call bell/phone within reach;with family/visitor present;with chair alarm set Nurse Communication: Mobility status PT Visit Diagnosis: Pain;Difficulty in walking, not elsewhere classified (R26.2) Pain - Right/Left: Left Pain - part of body: Knee     Time: 0539-7673 PT Time Calculation (min) (ACUTE ONLY): 16 min  Charges:  $Therapeutic Exercise: 8-22 mins                    Jannette Spanner PT, DPT Physical Therapist Acute Rehabilitation Services Preferred contact method: Secure Chat Weekend Pager Only: [phone] Office: [place] 09/05/2022, 11:37 AM

## 2022-09-05 NOTE — Progress Notes (Signed)
    Subjective:  Patient reports pain as mild to moderate.  Denies N/V/CP/SOB/Abd pain. Denies tingling and numbness in LE bilaterally.   Objective:   VITALS:   Vitals:   09/04/22 1657 09/04/22 2041 09/05/22 0101 09/05/22 0500  BP:  126/60 (!) 122/55 (!) 125/53  Pulse:  62 67 70  Resp:  '18 18 16  '$ Temp:  97.6 F (36.4 C) 97.9 F (36.6 C) 97.9 F (36.6 C)  TempSrc:  Oral Oral Oral  SpO2:  97% 95% 96%  Weight:      Height: '5\' 5"'$  (1.651 m)       Patient is lying comfortably in bed. NAD.  Neurologically intact ABD soft Neurovascular intact Sensation intact distally Intact pulses distally Dorsiflexion/Plantar flexion intact Incision: dressing C/D/I No cellulitis present Compartment soft   Lab Results  Component Value Date   WBC 15.5 (H) 09/05/2022   HGB 9.1 (L) 09/05/2022   HCT 27.7 (L) 09/05/2022   MCV 88.2 09/05/2022   PLT 214 09/05/2022   BMET    Component Value Date/Time   NA 138 09/05/2022 0309   K 4.0 09/05/2022 0309   CL 108 09/05/2022 0309   CO2 21 (L) 09/05/2022 0309   GLUCOSE 129 (H) 09/05/2022 0309   BUN 24 (H) 09/05/2022 0309   CREATININE 0.93 09/05/2022 0309   CALCIUM 8.4 (L) 09/05/2022 0309   GFRNONAA >60 09/05/2022 0309     Assessment/Plan: 1 Day Post-Op   Principal Problem:   Osteoarthritis of left knee   WBAT with walker DVT ppx:  Eliquis , SCDs, TEDS PO pain control PT/OT: She ambulated 16 feet with PT yesterday. Continue PT today.  Dispo: D/c home once cleared with PT.    Charlott Rakes, PA-C 09/05/2022, 7:30 AM  [facility]  [place] [address], [address], [place], [place] [address] Phone: [phone] [email or web address] Facebook  [employer]

## 2022-09-05 NOTE — TOC Transition Note (Signed)
Transition of Care [facility]) - CM/SW Discharge Note   Patient Details  Name: Dorothy Mcdonald MRN: [id number] Date of Birth: Jun 17, 1946  Transition of [facility]) CM/SW Contact:  Lennart Pall, LCSW Phone Number: 09/05/2022, 9:55 AM   Clinical Narrative:    Met briefly with pt who confirms she has needed DME at home.  OPPT already arranged with [facility].  No TOC needs.   Final next level of care: OP Rehab Barriers to Discharge: No Barriers Identified   Patient Goals and CMS Choice      Discharge Placement                         Discharge Plan and Services Additional resources added to the After Visit Summary for                  DME Arranged: N/A DME Agency: NA                  Social Determinants of Health (SDOH) Interventions SDOH Screenings   Food Insecurity: No Food Insecurity (09/04/2022)  Housing: Low Risk  (09/04/2022)  Transportation Needs: No Transportation Needs (09/04/2022)  Utilities: Not At Risk (09/04/2022)  Tobacco Use: Medium Risk (09/04/2022)     Readmission Risk Interventions     No data to display

## 2022-09-05 NOTE — Progress Notes (Signed)
Physical Therapy Treatment Patient Details Name: Dorothy Mcdonald MRN: [id number] DOB: 01/01/1946 Today's Date: 09/05/2022   History of Present Illness Pt is a 77 yo female presenting s/p L-TKA on 09/04/22. PMH: Afib, CKD< anxiety & depression, GERD, HLD, IBS    PT Comments    Pt ambulated in hallway and practiced safe stair technique.  Pt c/o nausea and required seated rest break upon completing steps (Vitals: 128/67 mmHg, 64bpm).  Pt reports she has these episodes at baseline and no MD has ever "found anything."  Provided  stair handout.  Family not present for stairs however verbally educated on sequence.  Pt and family feel pt is okay for d/c home, and pt would like to d/c home today.  Discussed session and pt preferences with RN.     Recommendations for follow up therapy are one component of a multi-disciplinary discharge planning process, led by the attending physician.  Recommendations may be updated based on patient status, additional functional criteria and insurance authorization.  Follow Up Recommendations  Follow physician's recommendations for discharge plan and follow up therapies     Assistance Recommended at Discharge Frequent or constant Supervision/Assistance  Patient can return home with the following A little help with walking and/or transfers;Assistance with cooking/housework;Assist for transportation;Help with stairs or ramp for entrance;A little help with bathing/dressing/bathroom   Equipment Recommendations  None recommended by PT    Recommendations for Other Services       Precautions / Restrictions Precautions Precautions: Fall;Knee Restrictions Other Position/Activity Restrictions: WBAT     Mobility  Bed Mobility Overal bed mobility: Needs Assistance Bed Mobility: Supine to Sit     Supine to sit: Supervision, HOB elevated          Transfers Overall transfer level: Needs assistance Equipment used: Rolling walker (2 wheels) Transfers: Sit to/from  Stand Sit to Stand: Min guard           General transfer comment: verbal cues for UE and LE positioning for pain control    Ambulation/Gait Ambulation/Gait assistance: Min guard Gait Distance (Feet): 200 Feet Assistive device: Rolling walker (2 wheels) Gait Pattern/deviations: Step-through pattern, Decreased stride length, Antalgic Gait velocity: decreased     General Gait Details: verbal cues for sequence, RW positioning, step length, posture   Stairs Stairs: Yes Stairs assistance: Min guard Stair Management: Step to pattern, Backwards, With walker Number of Stairs: 3 General stair comments: verbal cues sequence, RW positioning, safety; pt c/o nausea and required seate rest break upon completing steps (Vitals: 128/67 mmHg, 64bpm)  Pt reports she has these episodes at baseline and no MD has ever "found anything."  Provided handout.  Family not present for stairs however verbally educated on sequence.   Wheelchair Mobility    Modified Rankin (Stroke Patients Only)       Balance                                            Cognition Arousal/Alertness: Awake/alert Behavior During Therapy: WFL for tasks assessed/performed Overall Cognitive Status: Within Functional Limits for tasks assessed                                          Exercises     General Comments        Pertinent  Vitals/Pain Pain Assessment Pain Assessment: 0-10 Pain Score: 4  Pain Location: left knee Pain Descriptors / Indicators: Aching, Sore Pain Intervention(s): Monitored during session, Repositioned    Home Living                          Prior Function            PT Goals (current goals can now be found in the care plan section) Progress towards PT goals: Progressing toward goals    Frequency    7X/week      PT Plan Current plan remains appropriate    Co-evaluation              AM-PAC PT "6 Clicks" Mobility   Outcome  Measure  Help needed turning from your back to your side while in a flat bed without using bedrails?: None Help needed moving from lying on your back to sitting on the side of a flat bed without using bedrails?: A Little Help needed moving to and from a bed to a chair (including a wheelchair)?: A Little Help needed standing up from a chair using your arms (e.g., wheelchair or bedside chair)?: A Little Help needed to walk in hospital room?: A Little Help needed climbing 3-5 steps with a railing? : A Little 6 Click Score: 19    End of Session Equipment Utilized During Treatment: Gait belt Activity Tolerance: Patient limited by fatigue Patient left: in chair;with call bell/phone within reach;with family/visitor present Nurse Communication: Mobility status PT Visit Diagnosis: Other abnormalities of gait and mobility (R26.89) Pain - Right/Left: Left Pain - part of body: Knee     Time: 1240-1304 PT Time Calculation (min) (ACUTE ONLY): 24 min  Charges:  $Gait Training: 23-37 mins                    Jannette Spanner PT, DPT Physical Therapist Acute Rehabilitation Services Preferred contact method: Secure Chat Weekend Pager Only: [phone] Office: [phone]    Dorothy Mcdonald [name] 09/05/2022, 1:52 PM

## 2022-09-06 ENCOUNTER — Encounter (HOSPITAL_COMMUNITY): Payer: Self-pay | Admitting: Orthopedic Surgery

## 2022-09-06 DIAGNOSIS — M25562 Pain in left knee: Secondary | ICD-10-CM | POA: Diagnosis not present

## 2022-09-11 DIAGNOSIS — M25562 Pain in left knee: Secondary | ICD-10-CM | POA: Diagnosis not present

## 2022-09-17 DIAGNOSIS — Z96652 Presence of left artificial knee joint: Secondary | ICD-10-CM | POA: Diagnosis not present

## 2022-09-17 DIAGNOSIS — Z471 Aftercare following joint replacement surgery: Secondary | ICD-10-CM | POA: Diagnosis not present

## 2022-09-18 DIAGNOSIS — M25562 Pain in left knee: Secondary | ICD-10-CM | POA: Diagnosis not present

## 2022-09-24 DIAGNOSIS — M25562 Pain in left knee: Secondary | ICD-10-CM | POA: Diagnosis not present

## 2022-09-26 DIAGNOSIS — M25562 Pain in left knee: Secondary | ICD-10-CM | POA: Diagnosis not present

## 2022-10-01 DIAGNOSIS — M25562 Pain in left knee: Secondary | ICD-10-CM | POA: Diagnosis not present

## 2022-10-03 DIAGNOSIS — M25562 Pain in left knee: Secondary | ICD-10-CM | POA: Diagnosis not present

## 2022-10-07 DIAGNOSIS — M25562 Pain in left knee: Secondary | ICD-10-CM | POA: Diagnosis not present

## 2022-10-10 DIAGNOSIS — M25562 Pain in left knee: Secondary | ICD-10-CM | POA: Diagnosis not present

## 2022-10-14 DIAGNOSIS — M25562 Pain in left knee: Secondary | ICD-10-CM | POA: Diagnosis not present

## 2022-10-15 DIAGNOSIS — Z471 Aftercare following joint replacement surgery: Secondary | ICD-10-CM | POA: Diagnosis not present

## 2022-10-15 DIAGNOSIS — Z96652 Presence of left artificial knee joint: Secondary | ICD-10-CM | POA: Diagnosis not present

## 2022-10-18 DIAGNOSIS — M25562 Pain in left knee: Secondary | ICD-10-CM | POA: Diagnosis not present

## 2022-10-22 DIAGNOSIS — R799 Abnormal finding of blood chemistry, unspecified: Secondary | ICD-10-CM | POA: Diagnosis not present

## 2022-10-22 DIAGNOSIS — R262 Difficulty in walking, not elsewhere classified: Secondary | ICD-10-CM | POA: Diagnosis not present

## 2022-10-22 DIAGNOSIS — K2970 Gastritis, unspecified, without bleeding: Secondary | ICD-10-CM | POA: Diagnosis not present

## 2022-10-22 DIAGNOSIS — R519 Headache, unspecified: Secondary | ICD-10-CM | POA: Diagnosis not present

## 2022-10-22 DIAGNOSIS — E0590 Thyrotoxicosis, unspecified without thyrotoxic crisis or storm: Secondary | ICD-10-CM | POA: Diagnosis not present

## 2022-10-22 DIAGNOSIS — R5383 Other fatigue: Secondary | ICD-10-CM | POA: Diagnosis not present

## 2022-10-23 DIAGNOSIS — R5383 Other fatigue: Secondary | ICD-10-CM | POA: Diagnosis not present

## 2022-10-23 DIAGNOSIS — R519 Headache, unspecified: Secondary | ICD-10-CM | POA: Diagnosis not present

## 2022-10-23 DIAGNOSIS — K2970 Gastritis, unspecified, without bleeding: Secondary | ICD-10-CM | POA: Diagnosis not present

## 2022-10-24 DIAGNOSIS — M25562 Pain in left knee: Secondary | ICD-10-CM | POA: Diagnosis not present

## 2022-10-31 DIAGNOSIS — M25562 Pain in left knee: Secondary | ICD-10-CM | POA: Diagnosis not present

## 2022-11-07 DIAGNOSIS — M25562 Pain in left knee: Secondary | ICD-10-CM | POA: Diagnosis not present

## 2022-11-14 DIAGNOSIS — M25562 Pain in left knee: Secondary | ICD-10-CM | POA: Diagnosis not present

## 2023-01-01 ENCOUNTER — Encounter: Payer: Self-pay | Admitting: Gastroenterology

## 2023-01-04 IMAGING — CT CT HEAD W/O CM
4 series · 16 of 47 positions shown, 18 images · non-contrast
Comparison: None.

CLINICAL DATA: Nausea and dizziness over the last month



[Series 3: head wo · axial · 0.41mm/px · z∈[+1659,+1774]mm · 7 of 31 slices shown, 9 images]
[im 4/31  brain]
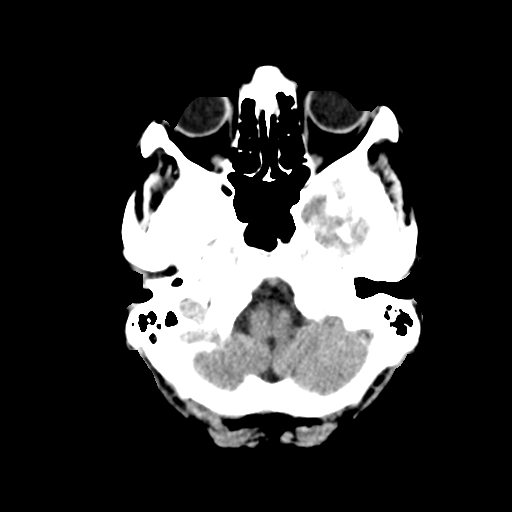
[im 4/31  bone]
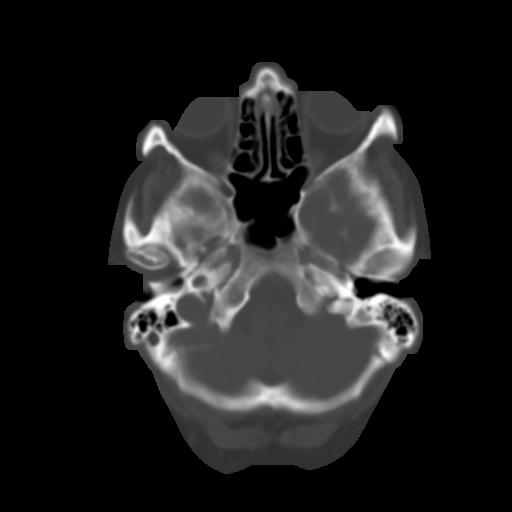
[im 8/31  brain]
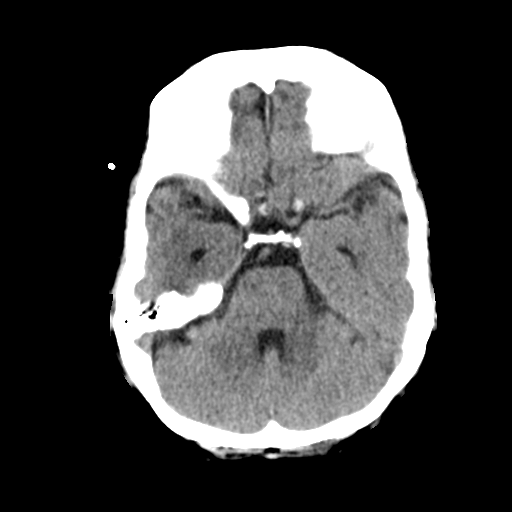
[im 12/31  brain]
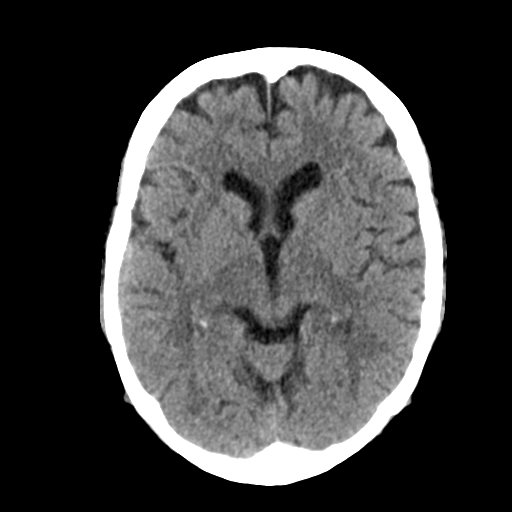
[im 16/31  brain]
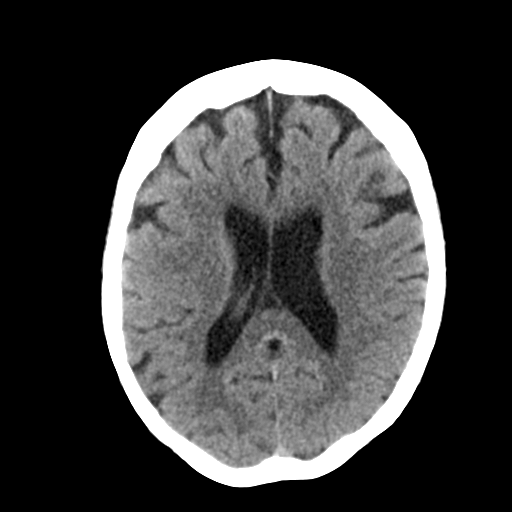
[im 19/31  brain]
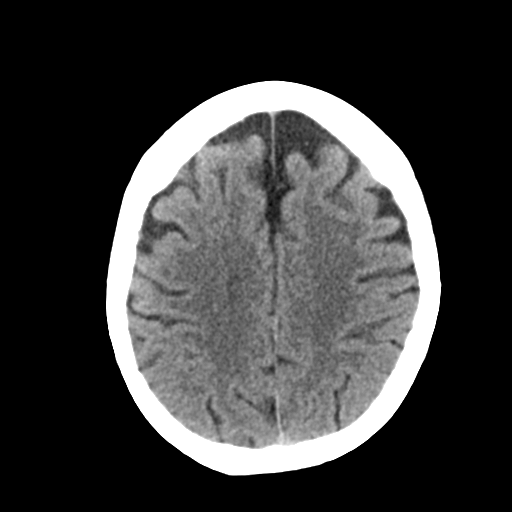
[im 19/31  bone]
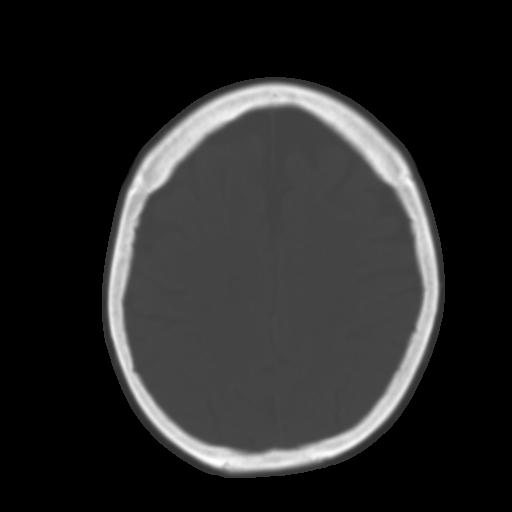
[im 23/31  brain]
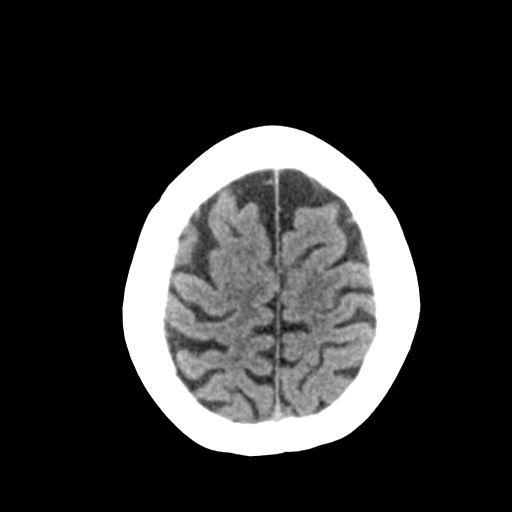
[im 27/31  brain]
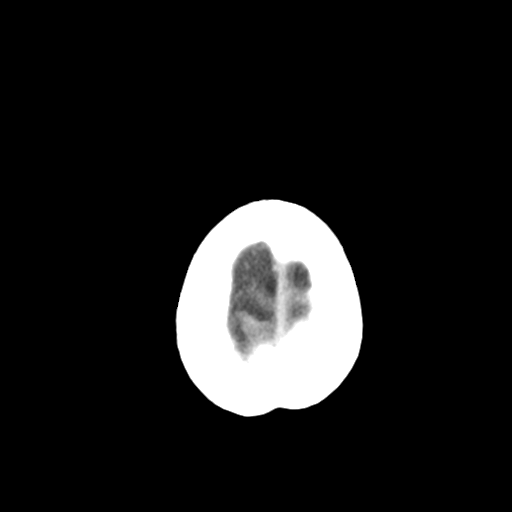

[Series 4: head bone · axial · 0.41mm/px · z∈[+1658,+1688]mm · 3 of 77 slices shown]
[im 8/77  bone]
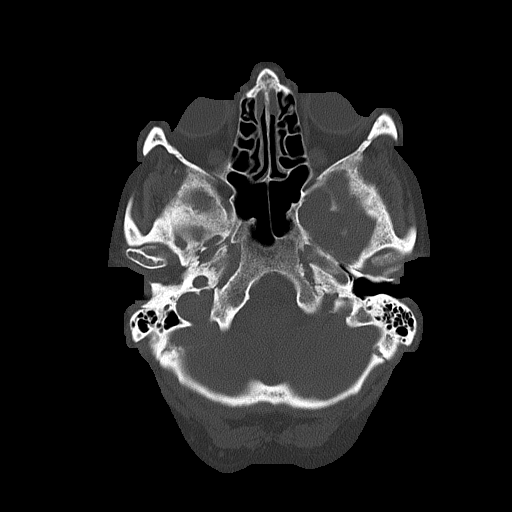
[im 16/77  bone]
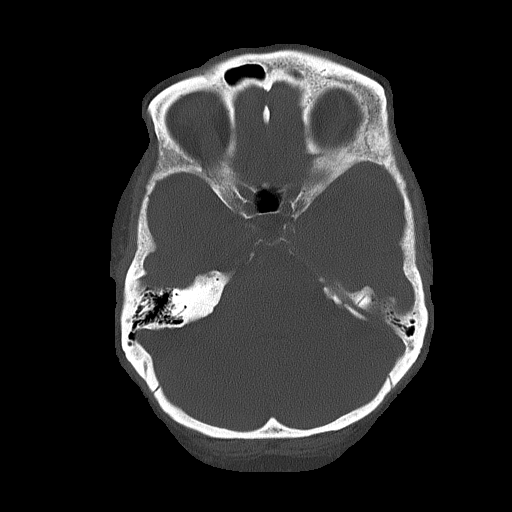
[im 23/77  bone]
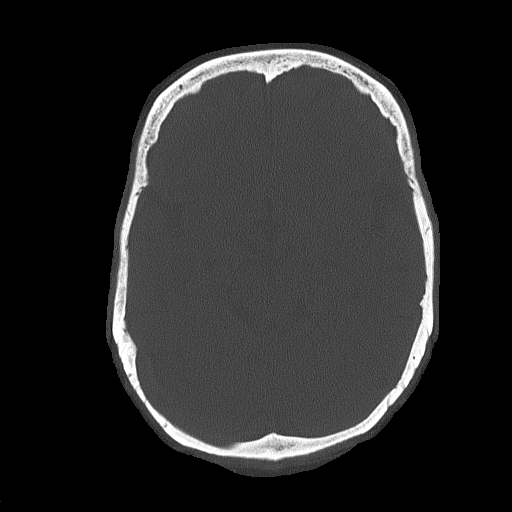

[Series 6: coronal soft tissue · coronal · 0.30mm/px · 3 of 67 slices shown]
[im 23/67  brain]
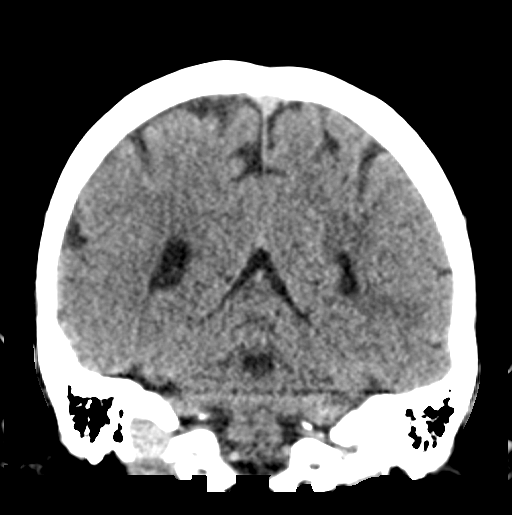
[im 30/67  brain]
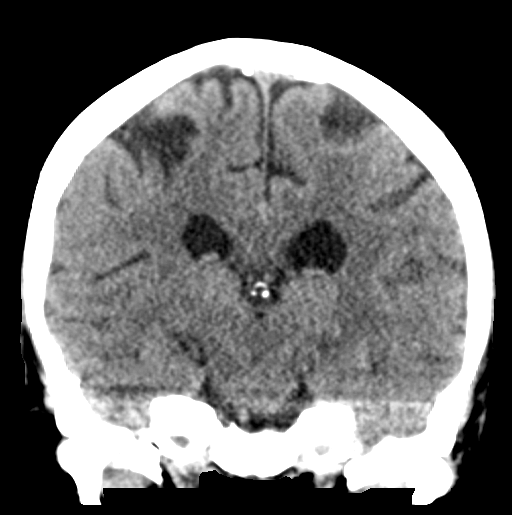
[im 37/67  brain]
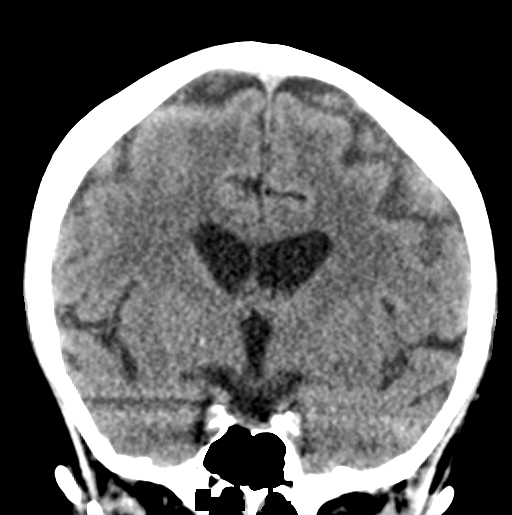

[Series 7: sagittal soft tissue · sagittal · 0.30mm/px · 3 of 52 slices shown]
[im 18/52  brain]
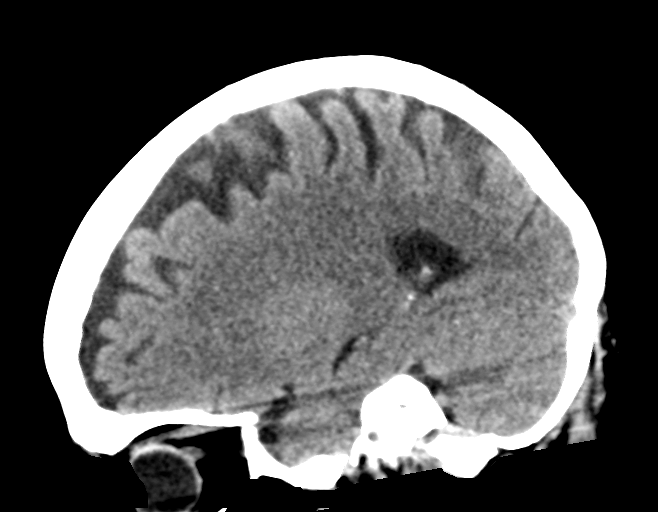
[im 26/52  brain]
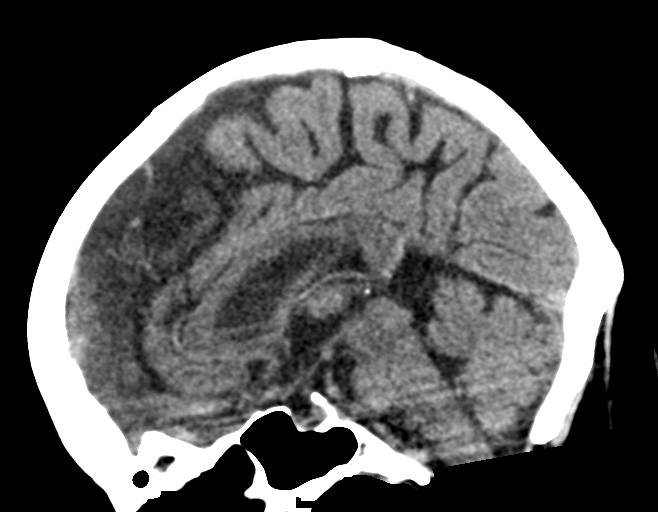
[im 35/52  brain]
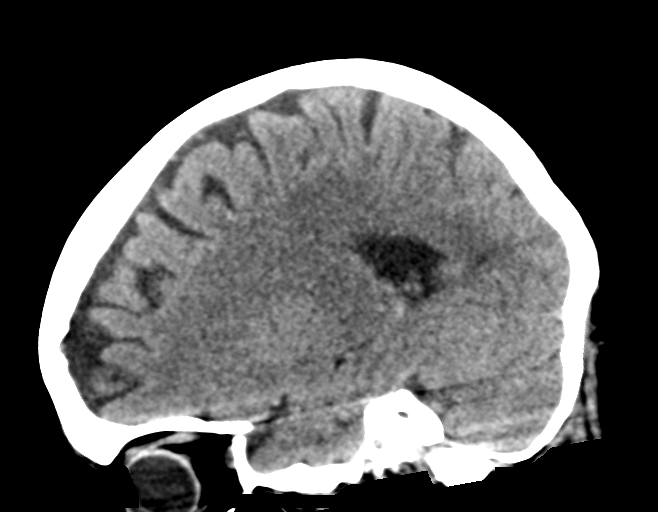

[16 of 47 positions shown; findings below may reference images not displayed]

FINDINGS: Brain: The brain shows a normal appearance without evidence of
malformation, atrophy, old or acute small or large vessel
infarction, mass lesion, hemorrhage, hydrocephalus or extra-axial
collection.

Vascular: There is minor atherosclerotic calcification in the
carotid siphon region.

Skull: Normal.  No traumatic finding.  No focal bone lesion.

Sinuses/Orbits: Sinuses are clear. Orbits appear normal. Mastoids
are clear.

Other: None significant
IMPRESSION: Normal head CT for age.

## 2023-03-11 DIAGNOSIS — H524 Presbyopia: Secondary | ICD-10-CM | POA: Diagnosis not present

## 2023-05-16 DIAGNOSIS — E079 Disorder of thyroid, unspecified: Secondary | ICD-10-CM | POA: Diagnosis not present

## 2023-05-16 DIAGNOSIS — I480 Paroxysmal atrial fibrillation: Secondary | ICD-10-CM | POA: Diagnosis not present

## 2023-05-17 DIAGNOSIS — I480 Paroxysmal atrial fibrillation: Secondary | ICD-10-CM | POA: Diagnosis not present

## 2023-05-20 DIAGNOSIS — K080 Exfoliation of teeth due to systemic causes: Secondary | ICD-10-CM | POA: Diagnosis not present

## 2023-06-09 DIAGNOSIS — M7052 Other bursitis of knee, left knee: Secondary | ICD-10-CM | POA: Diagnosis not present

## 2023-06-09 DIAGNOSIS — M1711 Unilateral primary osteoarthritis, right knee: Secondary | ICD-10-CM | POA: Diagnosis not present

## 2023-06-09 DIAGNOSIS — M7051 Other bursitis of knee, right knee: Secondary | ICD-10-CM | POA: Diagnosis not present

## 2023-06-24 DIAGNOSIS — M1711 Unilateral primary osteoarthritis, right knee: Secondary | ICD-10-CM | POA: Diagnosis not present

## 2023-06-25 ENCOUNTER — Ambulatory Visit: Payer: Medicare Other | Admitting: Internal Medicine

## 2023-06-25 ENCOUNTER — Encounter: Payer: Self-pay | Admitting: Internal Medicine

## 2023-06-25 VITALS — BP 124/78 | HR 80 | Ht 65.0 in | Wt 176.0 lb

## 2023-06-25 DIAGNOSIS — E0590 Thyrotoxicosis, unspecified without thyrotoxic crisis or storm: Secondary | ICD-10-CM

## 2023-06-25 LAB — COMPREHENSIVE METABOLIC PANEL
ALT: 17 U/L (ref 0–35)
AST: 23 U/L (ref 0–37)
Albumin: 4.3 g/dL (ref 3.5–5.2)
Alkaline Phosphatase: 75 U/L (ref 39–117)
BUN: 19 mg/dL (ref 6–23)
CO2: 24 meq/L (ref 19–32)
Calcium: 9.7 mg/dL (ref 8.4–10.5)
Chloride: 102 meq/L (ref 96–112)
Creatinine, Ser: 0.81 mg/dL (ref 0.40–1.20)
GFR: 71.25 mL/min (ref >60.00)
Glucose, Bld: 110 mg/dL — ABNORMAL HIGH (ref 70–99)
Potassium: 4.2 meq/L (ref 3.5–5.1)
Sodium: 138 meq/L (ref 135–145)
Total Bilirubin: 0.2 mg/dL (ref 0.2–1.2)
Total Protein: 7.4 g/dL (ref 6.0–8.3)

## 2023-06-25 LAB — CBC
HCT: 38 % (ref 36.0–46.0)
Hemoglobin: 11.8 g/dL — ABNORMAL LOW (ref 12.0–15.0)
MCHC: 30.9 g/dL (ref 30.0–36.0)
MCV: 83.9 fL (ref 78.0–100.0)
Platelets: 323 10*3/uL (ref 150.0–400.0)
RBC: 4.53 Mil/uL (ref 3.87–5.11)
RDW: 15.2 % (ref 11.5–15.5)
WBC: 13.7 10*3/uL — ABNORMAL HIGH (ref 4.0–10.5)

## 2023-06-25 LAB — T4, FREE: Free T4: 0.74 ng/dL (ref 0.60–1.60)

## 2023-06-25 LAB — T3, FREE: T3, Free: 3.7 pg/mL (ref 2.3–4.2)

## 2023-06-25 LAB — TSH: TSH: 0.04 u[IU]/mL — ABNORMAL LOW (ref 0.35–5.50)

## 2023-06-25 NOTE — Progress Notes (Unsigned)
Name: Dorothy Mcdonald  MRN/ DOB: [id number], 1946-01-16    Age/ Sex: 77 y.o., female    PCP: Kaleen Mask, MD   Reason for Endocrinology Evaluation: Subclinical hyperthyroidism     Date of Initial Endocrinology Evaluation: 06/25/2023     HPI: Dorothy Mcdonald is a 77 y.o. female with a past medical history of PAF, GERD, OA. The patient presented for initial endocrinology clinic visit on 06/25/2023 for consultative assistance with her subclinical hyperthyroidism.   Pt has been noted with low TSH and normal T4 and T3 since 2019 with a nadir of 0.022 uIU/ml in 06/2022  No hx of osteoporosis but has hx of low bone density  Pt with hx of paroxysmal A.Fib    She is having hair loss and excessive sleeping up to 18-20 hrs  She snores, no OSA , had normal sleep study in the past  Denies local neck swelling but has noted " tired voice"  when she sings in the choir Has noted fatigue Has noted retro- orbital pain  Denies loose or diarrhea, has chronic constipation  Has dry skin, with pruritus  She is concerned about anemia, due to eating multiple popsicle   No Biotin intake  No amiodarone or lithium disease    No Fh of thyroid disease   HISTORY:  Past Medical History:  Past Medical History:  Diagnosis Date   Adenomatous colon polyp 2003   Anxiety    Arthritis    Atrial fibrillation (HCC)    Chronic kidney disease    Depression    Dysrhythmia    Family history of adverse reaction to anesthesia    sister- problems waking up after anes. took several days to recover fully   Gallstones    GERD (gastroesophageal reflux disease)    Hepatic steatosis    History of hiatal hernia    Hyperlipidemia    IBS (irritable bowel syndrome)    Internal hemorrhoids    Irregular heart beat    Peptic ulcer disease    Rotator cuff disorder, left    TMJ (temporomandibular joint disorder)    Vitamin D deficiency    Past Surgical History:  Past Surgical History:  Procedure  Laterality Date   ANAL RECTAL MANOMETRY N/A 11/22/2016   Procedure: ANO RECTAL MANOMETRY;  Surgeon: Napoleon Form, MD;  Location: [facility];  Service: Endoscopy;  Laterality: N/A;   BREAST BIOPSY Right 08/13/2022   MM RT BREAST BX W LOC DEV 1ST LESION IMAGE BX SPEC STEREO GUIDE 08/13/2022 [facility]   BREAST LUMPECTOMY Right    CHOLECYSTECTOMY     COLONOSCOPY     HAND SURGERY     3 hand surgeries, Left hand- trigger fingers x 2,  right hand x 1   KNEE ARTHROPLASTY Left 09/04/2022   Procedure: COMPUTER ASSISTED TOTAL KNEE ARTHROPLASTY;  Surgeon: Samson Frederic, MD;  Location: [facility];  Service: Orthopedics;  Laterality: Left;  150   KNEE SURGERY     PARTIAL HYSTERECTOMY  1983   POLYPECTOMY     RECTOCELE AND CYSTOCELE REPAIR     TONSILLECTOMY      Social History:  reports that she quit smoking about 38 years ago. Her smoking use included cigarettes. She has never used smokeless tobacco. She reports that she does not drink alcohol and does not use drugs. Family History: family history includes Breast cancer in her sister; Colon cancer in her father, paternal aunt, and paternal uncle; Ovarian cancer in her mother; Uterine cancer  in her mother.   HOME MEDICATIONS: Allergies as of 06/25/2023       Reactions   Amoxicillin-pot Clavulanate Nausea And Vomiting   None listed per [facility].   Penicillins Nausea And Vomiting   Patient denies        Medication List        Accurate as of June 25, 2023  9:34 AM. If you have any questions, ask your nurse or doctor.          apixaban 5 MG Tabs tablet Commonly known as: ELIQUIS Take 5 mg by mouth 2 (two) times daily.   furosemide 80 MG tablet Commonly known as: LASIX Take 80 mg by mouth daily.   MAGNESIUM PO Take 1 tablet by mouth daily as needed (leg cramps).   metFORMIN 1000 MG tablet Commonly known as: GLUCOPHAGE Take 1,000 mg by mouth 2 (two) times daily with a meal.   metoprolol  succinate 25 MG 24 hr tablet Commonly known as: TOPROL-XL Take 1 tablet (25 mg total) by mouth daily. What changed: how much to take   nitroGLYCERIN 0.4 MG SL tablet Commonly known as: NITROSTAT Place 1 tablet (0.4 mg total) under the tongue every 5 (five) minutes as needed for chest pain.   omeprazole 40 MG capsule Commonly known as: PRILOSEC TAKE ONE CAPSULE BY MOUTH EVERY DAY   ondansetron 4 MG tablet Commonly known as: Zofran Take 1 tablet (4 mg total) by mouth every 8 (eight) hours as needed for nausea or vomiting.   oxyCODONE 5 MG immediate release tablet Commonly known as: Roxicodone Take 1 tablet (5 mg total) by mouth every 4 (four) hours as needed for severe pain.   rosuvastatin 10 MG tablet Commonly known as: CRESTOR Take 1 tablet (10 mg total) by mouth daily.   venlafaxine XR 150 MG 24 hr capsule Commonly known as: EFFEXOR-XR Take 150 mg by mouth daily.          REVIEW OF SYSTEMS: A comprehensive ROS was conducted with the patient and is negative except as per HPI and below:  ROS     OBJECTIVE:  VS: There were no vitals taken for this visit.   Wt Readings from Last 3 Encounters:  09/04/22 190 lb (86.2 kg)  08/27/22 190 lb (86.2 kg)  01/09/22 199 lb (90.3 kg)     EXAM: General: Pt appears well and is in NAD  Eyes: External eye exam normal without stare, lid lag or exophthalmos.  EOM intact.  PERRL.  Neck: General: Supple without adenopathy. Thyroid: Thyroid size normal.  No goiter or nodules appreciated. No thyroid bruit.  Lungs: Clear with good BS bilat   Heart: Auscultation: RRR.  Abdomen: Soft, nontender  Extremities:  BL LE: No pretibial edema   Mental Status: Judgment, insight: Intact Orientation: Oriented to time, place, and person Mood and affect: No depression, anxiety, or agitation     DATA REVIEWED: ***    ASSESSMENT/PLAN/RECOMMENDATIONS:   Subclinical Hyperthyroidism :  -Patient with multiple nonspecific symptoms -no  local neck symptoms -We discussed differential diagnosis to include Graves' disease versus autonomous thyroid nodule -Will proceed with TRAb and thyroid ultrasound -We discussed the risk of cardiac arrhythmia as well as increased bone resorption with chronic subclinical hyperthyroidism . -We discussed that Graves' Disease is a result of an autoimmune condition involving the thyroid.    We discussed with pt the benefits of methimazole in the Tx of hyperthyroidism, as well as the possible side effects/complications of anti-thyroid drug  Tx (specifically detailing the rare, but serious side effect of agranulocytosis). She was informed of need for regular thyroid function monitoring while on methimazole to ensure appropriate dosage without over-treatment. As well, we discussed the possible side effects of methimazole including the chance of rash, the small chance of liver irritation/juandice and the <=1 in 300-400 chance of sudden onset agranulocytosis.  We discussed importance of going to ED promptly (and stopping methimazole) if shewere to develop significant fever with severe sore throat of other evidence of acute infection.       We extensively discussed the various treatment options for hyperthyroidism and Graves disease including ablation therapy with radioactive iodine versus antithyroid drug treatment versus surgical therapy.  We recommended to the patient that we felt, at this time, that thionamide therapy would be most optimal.  We discussed the various possible benefits versus side effects of the various therapies.   I carefully explained to the patient that one of the consequences of I-131 ablation treatment would likely be permanent hypothyroidism which would require long-term replacement therapy with LT4.    Medications :   Follow-up in 4 months Labs in 2 months  Signed electronically by: Lyndle Herrlich, MD  [facility] Endocrinology  [facility] [address]  [place] [place], [place] [address] Phone: [phone] FAX: [phone]   CC: Kaleen Mask, MD [address] [place] [address] Phone: [phone] Fax: [phone]   Return to Endocrinology clinic as below: Future Appointments  Date Time Provider Department Center  06/25/2023  2:00 PM Kinzy Weyers, Konrad Dolores, MD [facility] None

## 2023-06-26 ENCOUNTER — Telehealth: Payer: Self-pay | Admitting: Internal Medicine

## 2023-06-26 MED ORDER — METHIMAZOLE 5 MG PO TABS: 5.0000 mg | ORAL_TABLET | ORAL | 3 refills | Status: DC

## 2023-06-26 NOTE — Telephone Encounter (Signed)
Please let the patient know that her thyroid test is worse than it has been and as discussed during the office visit, I would recommend starting methimazole as below    Methimazole 5 mg, 1 tablet Monday through Friday, none on Saturday and none on Sunday    Thanks

## 2023-06-27 ENCOUNTER — Ambulatory Visit
Admission: RE | Admit: 2023-06-27 | Discharge: 2023-06-27 | Disposition: A | Discharge status: Home / Self Care | Payer: Medicare Other | Source: Ambulatory Visit | Attending: Internal Medicine | Admitting: Internal Medicine

## 2023-06-27 DIAGNOSIS — E0590 Thyrotoxicosis, unspecified without thyrotoxic crisis or storm: Secondary | ICD-10-CM

## 2023-06-27 DIAGNOSIS — E042 Nontoxic multinodular goiter: Secondary | ICD-10-CM | POA: Diagnosis not present

## 2023-06-27 LAB — TRAB (TSH RECEPTOR BINDING ANTIBODY): TRAB: <1 IU/L (ref <=2.00)

## 2023-06-27 MED ORDER — METHIMAZOLE 5 MG PO TABS: 5.0000 mg | ORAL_TABLET | ORAL | 3 refills | Status: DC

## 2023-06-27 NOTE — Telephone Encounter (Signed)
Patient aware and medication sent to [facility] as requested

## 2023-07-01 DIAGNOSIS — M1711 Unilateral primary osteoarthritis, right knee: Secondary | ICD-10-CM | POA: Diagnosis not present

## 2023-07-08 DIAGNOSIS — M1711 Unilateral primary osteoarthritis, right knee: Secondary | ICD-10-CM | POA: Diagnosis not present

## 2023-08-12 ENCOUNTER — Other Ambulatory Visit: Payer: Medicare Other

## 2023-08-12 ENCOUNTER — Other Ambulatory Visit: Payer: Self-pay

## 2023-08-12 DIAGNOSIS — E0590 Thyrotoxicosis, unspecified without thyrotoxic crisis or storm: Secondary | ICD-10-CM

## 2023-08-13 ENCOUNTER — Telehealth: Payer: Self-pay | Admitting: Internal Medicine

## 2023-08-13 LAB — T4, FREE: Free T4: 0.9 ng/dL (ref 0.8–1.8)

## 2023-08-13 LAB — TSH: TSH: 0.2 m[IU]/L — ABNORMAL LOW (ref 0.40–4.50)

## 2023-08-13 MED ORDER — METHIMAZOLE 5 MG PO TABS: 5.0000 mg | ORAL_TABLET | Freq: Every day | ORAL | 3 refills | Status: DC

## 2023-08-13 NOTE — Telephone Encounter (Signed)
Please let the patient know that her thyroid test is better, but it continues to show slight overactivity.   Please increase methimazole to 1 tablet daily including Saturday and Sunday (she will take it 7 days out of the week)     Thanks

## 2023-08-13 NOTE — Telephone Encounter (Signed)
Patient aware and will make medication change

## 2023-10-02 DIAGNOSIS — D649 Anemia, unspecified: Secondary | ICD-10-CM | POA: Diagnosis not present

## 2023-10-02 DIAGNOSIS — Z0000 Encounter for general adult medical examination without abnormal findings: Secondary | ICD-10-CM | POA: Diagnosis not present

## 2023-10-02 DIAGNOSIS — E039 Hypothyroidism, unspecified: Secondary | ICD-10-CM | POA: Diagnosis not present

## 2023-10-02 DIAGNOSIS — R638 Other symptoms and signs concerning food and fluid intake: Secondary | ICD-10-CM | POA: Diagnosis not present

## 2023-10-13 DIAGNOSIS — R059 Cough, unspecified: Secondary | ICD-10-CM | POA: Diagnosis not present

## 2023-10-13 DIAGNOSIS — B349 Viral infection, unspecified: Secondary | ICD-10-CM | POA: Diagnosis not present

## 2023-10-15 DIAGNOSIS — R39198 Other difficulties with micturition: Secondary | ICD-10-CM | POA: Diagnosis not present

## 2023-10-27 ENCOUNTER — Encounter: Payer: Self-pay | Admitting: Internal Medicine

## 2023-10-27 ENCOUNTER — Ambulatory Visit: Payer: Medicare Other | Admitting: Internal Medicine

## 2023-10-27 VITALS — BP 120/80 | HR 80 | Ht 65.0 in | Wt 176.0 lb

## 2023-10-27 DIAGNOSIS — E0590 Thyrotoxicosis, unspecified without thyrotoxic crisis or storm: Secondary | ICD-10-CM

## 2023-10-27 DIAGNOSIS — E042 Nontoxic multinodular goiter: Secondary | ICD-10-CM

## 2023-10-27 NOTE — Progress Notes (Unsigned)
 Name: Dorothy Mcdonald  MRN/ DOB: [id number], November 19, 1945    Age/ Sex: 78 y.o., female    PCP: Kaleen Mask, MD   Reason for Endocrinology Evaluation: Subclinical hyperthyroidism     Date of Initial Endocrinology Evaluation: 06/25/2023    HPI: Dorothy Mcdonald is a 78 y.o. female with a past medical history of PAF, GERD, OA. The patient presented for initial endocrinology clinic visit on 06/25/2023 for consultative assistance with her subclinical hyperthyroidism.   Pt has been noted with low TSH and normal T4 and T3 since 2019 with a nadir of 0.022 uIU/ml in 06/2022  No hx of osteoporosis but has hx of low bone density  Pt with hx of paroxysmal A.Fib    No amiodarone or lithium   No Fh of thyroid disease    On her initial visit to our clinic she was complaining of hair loss, fatigue, excessive sleeping during the day, pruritus of the skin, TSH worsened to 0.04u international unit /mL   I started her on methimazole on initial visit    TRAb negative   Thyroid ultrasound shows multiple nodules, none met FNA or serial monitoring criteria  SUBJECTIVE:    Today (10/27/23): Josefine Class here for a follow up on hyperthyroidism.   Hair loss- stable Excessive sleeping- improving  Fatigue- slightly better Biotin- no  She does tend to have chronic constipation Weight stable  Continues with tremors  Continues with heat intolerance    Methimazole 5 mg daily   HISTORY:  Past Medical History:  Past Medical History:  Diagnosis Date   Adenomatous colon polyp 2003   Anxiety    Arthritis    Atrial fibrillation (HCC)    Chronic kidney disease    Depression    Dysrhythmia    Family history of adverse reaction to anesthesia    sister- problems waking up after anes. took several days to recover fully   Gallstones    GERD (gastroesophageal reflux disease)    Hepatic steatosis    History of hiatal hernia    Hyperlipidemia    IBS (irritable bowel syndrome)     Internal hemorrhoids    Irregular heart beat    Peptic ulcer disease    Rotator cuff disorder, left    TMJ (temporomandibular joint disorder)    Vitamin D deficiency    Past Surgical History:  Past Surgical History:  Procedure Laterality Date   ANAL RECTAL MANOMETRY N/A 11/22/2016   Procedure: ANO RECTAL MANOMETRY;  Surgeon: Napoleon Form, MD;  Location: [facility];  Service: Endoscopy;  Laterality: N/A;   BREAST BIOPSY Right 08/13/2022   MM RT BREAST BX W LOC DEV 1ST LESION IMAGE BX SPEC STEREO GUIDE 08/13/2022 [facility]   BREAST LUMPECTOMY Right    CHOLECYSTECTOMY     COLONOSCOPY     HAND SURGERY     3 hand surgeries, Left hand- trigger fingers x 2,  right hand x 1   KNEE ARTHROPLASTY Left 09/04/2022   Procedure: COMPUTER ASSISTED TOTAL KNEE ARTHROPLASTY;  Surgeon: Samson Frederic, MD;  Location: [facility];  Service: Orthopedics;  Laterality: Left;  150   KNEE SURGERY     PARTIAL HYSTERECTOMY  1983   POLYPECTOMY     RECTOCELE AND CYSTOCELE REPAIR     TONSILLECTOMY      Social History:  reports that she quit smoking about 39 years ago. Her smoking use included cigarettes. She has never used smokeless tobacco. She reports that she does not drink  alcohol and does not use drugs. Family History: family history includes Breast cancer in her sister; Colon cancer in her father, paternal aunt, and paternal uncle; Ovarian cancer in her mother; Uterine cancer in her mother.   HOME MEDICATIONS: Allergies as of 10/27/2023       Reactions   Amoxicillin-pot Clavulanate Nausea And Vomiting   None listed per [facility].   Penicillins Nausea And Vomiting   Patient denies        Medication List        Accurate as of October 27, 2023  1:20 PM. If you have any questions, ask your nurse or doctor.          apixaban 5 MG Tabs tablet Commonly known as: ELIQUIS Take 5 mg by mouth 2 (two) times daily.   buPROPion 200 MG 12 hr tablet Commonly known as:  WELLBUTRIN SR Take 200 mg by mouth 2 (two) times daily.   citalopram 20 MG tablet Commonly known as: CELEXA Take by mouth.   clindamycin 300 MG capsule Commonly known as: CLEOCIN TAKE 2 CAPSULES BY MOUTH 1 HOUR PRIOR TO DENTAL APPOINTMENT   furosemide 80 MG tablet Commonly known as: LASIX Take 80 mg by mouth daily.   furosemide 20 MG tablet Commonly known as: LASIX Take 20 mg by mouth daily.   MAGNESIUM PO Take 1 tablet by mouth daily as needed (leg cramps).   metFORMIN 1000 MG tablet Commonly known as: GLUCOPHAGE Take 1,000 mg by mouth 2 (two) times daily with a meal.   metFORMIN 850 MG tablet Commonly known as: GLUCOPHAGE Take 850 mg by mouth daily with breakfast.   methimazole 5 MG tablet Commonly known as: TAPAZOLE Take 1 tablet (5 mg total) by mouth daily.   methocarbamol 500 MG tablet Commonly known as: ROBAXIN TAKE 2 TABLETS BY MOUTH EVERY 6 TO 8 HOURS AS NEEDED   metoprolol succinate 25 MG 24 hr tablet Commonly known as: TOPROL-XL Take 1 tablet (25 mg total) by mouth daily. What changed: how much to take   nitroGLYCERIN 0.4 MG SL tablet Commonly known as: NITROSTAT Place 1 tablet (0.4 mg total) under the tongue every 5 (five) minutes as needed for chest pain.   omeprazole 40 MG capsule Commonly known as: PRILOSEC TAKE ONE CAPSULE BY MOUTH EVERY DAY   oxyCODONE 5 MG immediate release tablet Commonly known as: Roxicodone Take 1 tablet (5 mg total) by mouth every 4 (four) hours as needed for severe pain.   rosuvastatin 10 MG tablet Commonly known as: CRESTOR Take 1 tablet (10 mg total) by mouth daily.   venlafaxine XR 150 MG 24 hr capsule Commonly known as: EFFEXOR-XR Take 150 mg by mouth daily.          REVIEW OF SYSTEMS: A comprehensive ROS was conducted with the patient and is negative except as per HPI     OBJECTIVE:  VS: BP 120/80 (BP Location: Right Arm, Patient Position: Sitting, Cuff Size: Normal)   Pulse 80   Ht 5\' 5"  (1.651 m)    Wt 176 lb (79.8 kg)   SpO2 99%   BMI 29.29 kg/m     Wt Readings from Last 3 Encounters:  10/27/23 176 lb (79.8 kg)  06/25/23 176 lb (79.8 kg)  09/04/22 190 lb (86.2 kg)     EXAM: General: Pt appears well and is in NAD  Neck: General: Supple without adenopathy. Thyroid: Thyroid size normal.  No goiter or nodules appreciated.  Lungs: Clear with good  BS bilat   Heart: Auscultation: RRR.  Abdomen: Soft, nontender  Extremities:  BL LE: No pretibial edema   Mental Status: Judgment, insight: Intact Orientation: Oriented to time, place, and person Mood and affect: No depression, anxiety, or agitation     DATA REVIEWED:   Latest Reference Range & Units 10/27/23 13:20  TSH 0.40 - 4.50 mIU/L 0.59  Triiodothyronine,Free,Serum 2.3 - 4.2 pg/mL 3.0  T4,Free(Direct) 0.8 - 1.8 ng/dL 0.8      Latest Reference Range & Units 06/25/23 14:38  COMPREHENSIVE METABOLIC PANEL  Rpt !  Sodium 135 - 145 mEq/L 138  Potassium 3.5 - 5.1 mEq/L 4.2  Chloride 96 - 112 mEq/L 102  CO2 19 - 32 mEq/L 24  Glucose 70 - 99 mg/dL 161 (H)  BUN 6 - 23 mg/dL 19  Creatinine 0.96 - 0.45 mg/dL 4.09  Calcium 8.4 - 81.1 mg/dL 9.7  Alkaline Phosphatase 39 - 117 U/L 75  Albumin 3.5 - 5.2 g/dL 4.3  AST 0 - 37 U/L 23  ALT 0 - 35 U/L 17  Total Protein 6.0 - 8.3 g/dL 7.4  Total Bilirubin 0.2 - 1.2 mg/dL 0.2  GFR >91.47 mL/min 71.25    Latest Reference Range & Units 06/25/23 14:38  WBC 4.0 - 10.5 K/uL 13.7 (H)  RBC 3.87 - 5.11 Mil/uL 4.53  Hemoglobin 12.0 - 15.0 g/dL 82.9 (L)  HCT 56.2 - 13.0 % 38.0  MCV 78.0 - 100.0 fl 83.9  MCHC 30.0 - 36.0 g/dL 86.5  RDW 78.4 - 69.6 % 15.2  Platelets 150.0 - 400.0 K/uL 323.0  Glucose 70 - 99 mg/dL 295 (H)  TSH 2.84 - 1.32 uIU/mL 0.04 (L)  Triiodothyronine,Free,Serum 2.3 - 4.2 pg/mL 3.7  T4,Free(Direct) 0.60 - 1.60 ng/dL 4.40    Latest Reference Range & Units 06/25/23 14:38  TRAB <=2.00 IU/L <1.00   Thyroid ultrasound 06/27/2023    Estimated total number of  nodules >/= 1 cm: 6-10   Number of spongiform nodules >/=  2 cm not described below (TR1): 0   Number of mixed cystic and solid nodules >/= 1.5 cm not described below (TR2): 0   _________________________________________________________   There are multiple, nearly innumerable, mixed cystic and solid, spongiform/benign-appearing and colloid containing nodules throughout the right lobe of the thyroid the largest of which within the mid aspect of the right lobe of the thyroid measuring 1.9 x 0.6 x 0.5 cm (labeled 3) though none of which meet imaging criteria to recommend percutaneous sampling or continued dedicated follow-up.   _________________________________________________________   Similar to the right lobe, there are multiple, nearly innumerable, mixed cystic and solid, spongiform/benign-appearing nodules throughout the left lobe of the thyroid, the largest of which measures 1.2 x 0.8 x 0.7 cm (labeled 7), and none of which meet criteria to recommend percutaneous sampling or continued dedicated follow-up.   IMPRESSION: 1. Findings compatible with multinodular goiter. 2. Multiple, nearly innumerable, mixed cystic and solid, spongiform/benign-appearing and colloid containing nodule throughout both the right and left lobes of the thyroid, none of which meet imaging criteria to recommend percutaneous sampling or continued dedicated follow-up.   ASSESSMENT/PLAN/RECOMMENDATIONS:   Hyperthyroidism :  -Most likely secondary to autonomous thyroid nodules -TFTs normal, no change  Medications : Continue methimazole 5 mg, 1 tablet daily   2.  Multinodular goiter:  -This is based on thyroid ultrasound 06/2023, none of the nodules met FNA or follow-up criteria -This is the most likely reason for hyperthyroidism   Follow-up in 4 months  I spent 25 minutes preparing to see the patient by review of recent labs, imaging and procedures, obtaining and reviewing separately  obtained history, communicating with the patient, ordering medications, tests or procedures, and documenting clinical information in the EHR including the differential Dx, treatment, and any further evaluation and other management    Signed electronically by: Lyndle Herrlich, MD  [facility] Endocrinology  [facility] [address] [place] [place], [place] [address] Phone: [phone] FAX: [phone]   CC: Kaleen Mask, MD [address] [place] [address] Phone: [phone] Fax: [phone]   Return to Endocrinology clinic as below: Future Appointments  Date Time Provider Department Center  02/24/2024  1:20 PM Aaliyan Brinkmeier, Konrad Dolores, MD [id number] None

## 2023-10-28 ENCOUNTER — Encounter: Payer: Self-pay | Admitting: Internal Medicine

## 2023-10-28 LAB — T3, FREE: T3, Free: 3 pg/mL (ref 2.3–4.2)

## 2023-10-28 LAB — TSH: TSH: 0.59 m[IU]/L (ref 0.40–4.50)

## 2023-10-28 LAB — T4, FREE: Free T4: 0.8 ng/dL (ref 0.8–1.8)

## 2023-10-28 MED ORDER — METHIMAZOLE 5 MG PO TABS: 5.0000 mg | ORAL_TABLET | Freq: Every day | ORAL | 3 refills | Status: DC

## 2023-11-03 ENCOUNTER — Other Ambulatory Visit: Payer: Self-pay | Admitting: Family Medicine

## 2023-11-03 DIAGNOSIS — Z1231 Encounter for screening mammogram for malignant neoplasm of breast: Secondary | ICD-10-CM

## 2023-11-06 ENCOUNTER — Ambulatory Visit
Admission: RE | Admit: 2023-11-06 | Discharge: 2023-11-06 | Disposition: A | Discharge status: Home / Self Care | Source: Ambulatory Visit | Attending: Family Medicine | Admitting: Family Medicine

## 2023-11-06 DIAGNOSIS — Z1231 Encounter for screening mammogram for malignant neoplasm of breast: Secondary | ICD-10-CM

## 2023-11-14 ENCOUNTER — Other Ambulatory Visit: Payer: Self-pay

## 2023-11-14 DIAGNOSIS — E0590 Thyrotoxicosis, unspecified without thyrotoxic crisis or storm: Secondary | ICD-10-CM

## 2023-11-14 MED ORDER — METHIMAZOLE 5 MG PO TABS: 5.0000 mg | ORAL_TABLET | Freq: Every day | ORAL | 3 refills | Status: DC

## 2023-11-25 DIAGNOSIS — K080 Exfoliation of teeth due to systemic causes: Secondary | ICD-10-CM | POA: Diagnosis not present

## 2023-12-10 ENCOUNTER — Encounter: Payer: Self-pay | Admitting: Physician Assistant

## 2023-12-10 ENCOUNTER — Ambulatory Visit: Payer: Medicare Other | Admitting: Physician Assistant

## 2023-12-10 VITALS — BP 110/60 | HR 70 | Ht 65.0 in | Wt 176.0 lb

## 2023-12-10 DIAGNOSIS — Z7901 Long term (current) use of anticoagulants: Secondary | ICD-10-CM

## 2023-12-10 DIAGNOSIS — I480 Paroxysmal atrial fibrillation: Secondary | ICD-10-CM

## 2023-12-10 DIAGNOSIS — Z860101 Personal history of adenomatous and serrated colon polyps: Secondary | ICD-10-CM | POA: Diagnosis not present

## 2023-12-10 MED ORDER — NA SULFATE-K SULFATE-MG SULF 17.5-3.13-1.6 GM/177ML PO SOLN: 1.0000 | Freq: Once | ORAL | 0 refills | Status: AC

## 2023-12-10 NOTE — Progress Notes (Signed)
 Chief Complaint: Discuss colonoscopy on chronic anticoagulation  HPI:    Dorothy Mcdonald is a 78 year old female with a past medical history as listed below including A-fib on Eliquis (02/24/2021 echo with LVEF 60-65%), known to Dr. Lavon Paganini for her history of adenomatous polyps, who was referred to me by Kaleen Mask, * for an sideration of a repeat colonoscopy on chronic anticoagulation.    11/16/2019 colonoscopy done for personal history of colon polyps and adenoma 10 mm or greater in size with 3 sessile polyps in the transverse colon and ileocecal valve measuring 4 to 7 mm in size, few small mouth diverticula nonbleeding internal hemorrhoids.  Pathology showed adenomatous polyps and repeat recommended in 3 years.    05/12/2022 EGD for epigastric abdominal pain, reflux with LA grade B reflux esophagitis, 5 cm hiatal hernia and gastritis.    06/25/23 CMP normal other than glucose of 110.  CBC with a hemoglobin minimally decreased at 11.8.  WBC 13.7.    Today, the patient presents to clinic and tells me that she is ready to have another colonoscopy.  She is not having any acute GI complaints or concerns.  Does tell me that the bowel prep was somewhat hard for her to finish last time and asked about suggestions to help with this.  She did [facility] and was doing well on her Eliquis for A-fib.    Denies fever, chills or weight loss.  Past Medical History:  Diagnosis Date   Adenomatous colon polyp 2003   Anxiety    Arthritis    Atrial fibrillation (HCC)    Chronic kidney disease    Depression    Dysrhythmia    Family history of adverse reaction to anesthesia    sister- problems waking up after anes. took several days to recover fully   Gallstones    GERD (gastroesophageal reflux disease)    Hepatic steatosis    History of hiatal hernia    Hyperlipidemia    IBS (irritable bowel syndrome)    Internal hemorrhoids    Irregular heart  beat    Peptic ulcer disease    Rotator cuff disorder, left    TMJ (temporomandibular joint disorder)    Vitamin D deficiency     Past Surgical History:  Procedure Laterality Date   ANAL RECTAL MANOMETRY N/A 11/22/2016   Procedure: ANO RECTAL MANOMETRY;  Surgeon: Napoleon Form, MD;  Location: [facility];  Service: Endoscopy;  Laterality: N/A;   BREAST BIOPSY Right 08/13/2022   MM RT BREAST BX W LOC DEV 1ST LESION IMAGE BX SPEC STEREO GUIDE 08/13/2022 [facility]AMMOGRAPHY   BREAST LUMPECTOMY Right    CHOLECYSTECTOMY     COLONOSCOPY     HAND SURGERY     3 hand surgeries, Left hand- trigger fingers x 2,  right hand x 1   KNEE ARTHROPLASTY Left 09/04/2022   Procedure: COMPUTER ASSISTED TOTAL KNEE ARTHROPLASTY;  Surgeon: Samson Frederic, MD;  Location: [facility];  Service: Orthopedics;  Laterality: Left;  150   KNEE SURGERY     PARTIAL HYSTERECTOMY  1983   POLYPECTOMY     RECTOCELE AND CYSTOCELE REPAIR     TONSILLECTOMY      Current Outpatient Medications  Medication Sig Dispense Refill   apixaban (ELIQUIS) 5 MG TABS tablet Take 5 mg by mouth 2 (two) times daily.     citalopram (CELEXA) 20 MG tablet Take by mouth.  clindamycin (CLEOCIN) 300 MG capsule TAKE 2 CAPSULES BY MOUTH 1 HOUR PRIOR TO DENTAL APPOINTMENT     furosemide (LASIX) 20 MG tablet Take 20 mg by mouth daily. (Patient not taking: Reported on 10/27/2023)     MAGNESIUM PO Take 1 tablet by mouth daily as needed (leg cramps).     metFORMIN (GLUCOPHAGE) 850 MG tablet Take 850 mg by mouth daily with breakfast.     methimazole (TAPAZOLE) 5 MG tablet Take 1 tablet (5 mg total) by mouth daily. 90 tablet 3   methocarbamol (ROBAXIN) 500 MG tablet TAKE 2 TABLETS BY MOUTH EVERY 6 TO 8 HOURS AS NEEDED     metoprolol succinate (TOPROL-XL) 25 MG 24 hr tablet Take 1 tablet (25 mg total) by mouth daily. (Patient taking differently: Take 50 mg by mouth daily.) 30 tablet 0   nitroGLYCERIN (NITROSTAT) 0.4 MG SL tablet Place 1 tablet (0.4  mg total) under the tongue every 5 (five) minutes as needed for chest pain. 30 tablet 1   omeprazole (PRILOSEC) 40 MG capsule TAKE ONE CAPSULE BY MOUTH EVERY DAY 90 capsule 3   rosuvastatin (CRESTOR) 10 MG tablet Take 1 tablet (10 mg total) by mouth daily. 30 tablet 0   venlafaxine XR (EFFEXOR-XR) 150 MG 24 hr capsule Take 150 mg by mouth daily.     No current facility-administered medications for this visit.    Allergies as of 12/10/2023 - Review Complete 12/10/2023  Allergen Reaction Noted   Amoxicillin-pot clavulanate Nausea And Vomiting 11/19/2016   Penicillins Nausea And Vomiting 08/31/2016    Family History  Problem Relation Age of Onset   Colon cancer Father        DIED AT 62   Colon cancer Paternal Aunt    Colon cancer Paternal Uncle        x 3   Ovarian cancer Mother    Uterine cancer Mother    Breast cancer Sister    Esophageal cancer Neg Hx    Rectal cancer Neg Hx    Stomach cancer Neg Hx     Social History   Socioeconomic History   Marital status: Married    Spouse name: Not on file   Number of children: 4   Years of education: Not on file   Highest education level: Not on file  Occupational History   Occupation: retired  Tobacco Use   Smoking status: Former    Current packs/day: 0.00    Types: Cigarettes    Quit date: 09/02/1984    Years since quitting: 39.2   Smokeless tobacco: Never  Vaping Use   Vaping status: Never Used  Substance and Sexual Activity   Alcohol use: No   Drug use: No   Sexual activity: Yes    Birth control/protection: Post-menopausal  Other Topics Concern   Not on file  Social History Narrative   Not on file   Social Drivers of Health   Financial Resource Strain: Not on file  Food Insecurity: No Food Insecurity (09/04/2022)   Hunger Vital Sign    Worried About Running Out of Food in the Last Year: Never true    Ran Out of Food in the Last Year: Never true  Transportation Needs: No Transportation Needs (09/04/2022)   PRAPARE  - Administrator, Civil Service (Medical): No    Lack of Transportation (Non-Medical): No  Physical Activity: Not on file  Stress: Not on file  Social Connections: Not on file  Intimate Partner Violence: Not At  Risk (09/04/2022)   Humiliation, Afraid, Rape, and Kick questionnaire    Fear of Current or Ex-Partner: No    Emotionally Abused: No    Physically Abused: No    Sexually Abused: No    Review of Systems:    Constitutional: No weight loss, fever or chills Skin: No rash  Cardiovascular: No chest pain  Respiratory: No SOB  Gastrointestinal: See HPI and otherwise negative Genitourinary: No dysuria  Neurological: No headache, dizziness or syncope Musculoskeletal: No new muscle or joint pain Hematologic: No bleeding  Psychiatric: No history of depression or anxiety   Physical Exam:  Vital signs: BP 110/60   Pulse 70   Ht 5\' 5"  (1.651 m)   Wt 176 lb (79.8 kg)   BMI 29.29 kg/m    Constitutional:   Pleasant elderly Caucasian female appears to be in NAD, Well developed, Well nourished, alert and cooperative Head:  Normocephalic and atraumatic. Eyes:   PEERL, EOMI. No icterus. Conjunctiva pink. Ears:  Normal auditory acuity. Neck:  Supple Throat: Oral cavity and pharynx without inflammation, swelling or lesion.  Respiratory: Respirations even and unlabored. Lungs clear to auscultation bilaterally.   No wheezes, crackles, or rhonchi.  Cardiovascular: Normal S1, S2. No MRG. Regular rate and rhythm. No peripheral edema, cyanosis or pallor.  Gastrointestinal:  Soft, nondistended, nontender. No rebound or guarding. Normal bowel sounds. No appreciable masses or hepatomegaly. Rectal:  Not performed.  Msk:  Symmetrical without gross deformities. Without edema, no deformity or joint abnormality.  Neurologic:  Alert and  oriented x4;  grossly normal neurologically.  Skin:   Dry and intact without significant lesions or rashes. Psychiatric: Demonstrates good judgement and  reason without abnormal affect or behaviors.  RELEVANT LABS AND IMAGING: CBC    Component Value Date/Time   WBC 13.7 (H) 06/25/2023 1438   RBC 4.53 06/25/2023 1438   HGB 11.8 (L) 06/25/2023 1438   HCT 38.0 06/25/2023 1438   PLT 323.0 06/25/2023 1438   MCV 83.9 06/25/2023 1438   MCH 29.0 09/05/2022 0309   MCHC 30.9 06/25/2023 1438   RDW 15.2 06/25/2023 1438    CMP     Component Value Date/Time   NA 138 06/25/2023 1438   K 4.2 06/25/2023 1438   CL 102 06/25/2023 1438   CO2 24 06/25/2023 1438   GLUCOSE 110 (H) 06/25/2023 1438   BUN 19 06/25/2023 1438   CREATININE 0.81 06/25/2023 1438   CALCIUM 9.7 06/25/2023 1438   PROT 7.4 06/25/2023 1438   ALBUMIN 4.3 06/25/2023 1438   AST 23 06/25/2023 1438   ALT 17 06/25/2023 1438   ALKPHOS 75 06/25/2023 1438   BILITOT 0.2 06/25/2023 1438   GFRNONAA >60 09/05/2022 0309   GFRAA >60 08/31/2016 1034    Assessment: 1.  History of adenomatous polyps: Last colonoscopy in 21 with 3 adenomatous polyps and repeat recommended in 3 years, patient is overdue now 2.  A-fib on Eliquis  Plan: 1.  Patient scheduled for surveillance colonoscopy in the [facility] with Dr. Lavon Paganini.  Did provide the patient a detailed list of risks for the procedure and she agrees to proceed. Patient is appropriate for endoscopic procedure(s) in the ambulatory ([facility]) setting.  2.  Patient advised to hold her Eliquis for 2 days prior to time of procedure.  We will communicate with her prescribing physician to ensure this is acceptable for her. 3.  Patient will have a 2-day bowel prep given history of constipation 4.  Prescribed Reglan 10 mg x 2, 1  tab 20 to 30 minutes before each half of prep #2 with no refill 5.  Patient to follow in clinic per recommendations after time of procedure.  Hyacinth Meeker, PA-C [facility] 12/10/2023, 10:18 AM  Cc: Kaleen Mask, *

## 2023-12-10 NOTE — Patient Instructions (Signed)
 We have sent the following medications to your pharmacy for you to pick up at your convenience: Reglan 10 mg 20-30 minutes before drinking colonoscopy prep.   You will be contacted by our office prior to your procedure for directions on holding your Eliquis.  If you do not hear from our office 1 week prior to your scheduled procedure, please call [phone] to discuss.    You have been scheduled for a colonoscopy. Please follow written instructions given to you at your visit today.   If you use inhalers (even only as needed), please bring them with you on the day of your procedure.  DO NOT TAKE 7 DAYS PRIOR TO TEST- Trulicity (dulaglutide) Ozempic, Wegovy (semaglutide) Mounjaro (tirzepatide) Bydureon Bcise (exanatide extended release)  DO NOT TAKE 1 DAY PRIOR TO YOUR TEST Rybelsus (semaglutide) Adlyxin (lixisenatide) Victoza (liraglutide) Byetta (exanatide) _____________________________________________________________________________________________________________________________  If your blood pressure at your visit was 140/90 or greater, please contact your primary care physician to follow up on this.  _______________________________________________________  If you are age 16 or older, your body mass index should be between 23-30. Your Body mass index is 29.29 kg/m. If this is out of the aforementioned range listed, please consider follow up with your Primary Care Provider.  If you are age 28 or younger, your body mass index should be between 19-25. Your Body mass index is 29.29 kg/m. If this is out of the aformentioned range listed, please consider follow up with your Primary Care Provider.   ________________________________________________________  The [facility] providers would like to encourage you to use [facility] to communicate with providers for non-urgent requests or questions.  Due to long hold times on the telephone, sending your provider a message by [facility] may be a  faster and more efficient way to get a response.  Please allow 48 business hours for a response.  Please remember that this is for non-urgent requests.  _______________________________________________________

## 2024-02-02 ENCOUNTER — Telehealth: Payer: Self-pay | Admitting: *Deleted

## 2024-02-05 NOTE — Telephone Encounter (Signed)
 Faxed request to [facility].

## 2024-02-13 NOTE — Telephone Encounter (Signed)
 Received clearance approval from Dr. Jolly Needle. Patient may hold Eliquis  for 2 days prior to procedure. Approval sent to scan center. Left message for patient to call office.

## 2024-02-16 ENCOUNTER — Other Ambulatory Visit: Payer: Self-pay | Admitting: *Deleted

## 2024-02-16 MED ORDER — METOCLOPRAMIDE HCL 10 MG PO TABS: ORAL_TABLET | ORAL | 0 refills | Status: DC

## 2024-02-16 NOTE — Telephone Encounter (Signed)
 Patient informed patient of holding Eliquis . Patient voiced understanding.

## 2024-02-16 NOTE — Telephone Encounter (Signed)
 Patient returning call.

## 2024-02-24 ENCOUNTER — Ambulatory Visit: Payer: Medicare Other | Admitting: Internal Medicine

## 2024-02-24 VITALS — BP 134/70 | HR 72 | Ht 65.0 in | Wt 172.6 lb

## 2024-02-24 DIAGNOSIS — E042 Nontoxic multinodular goiter: Secondary | ICD-10-CM | POA: Diagnosis not present

## 2024-02-24 DIAGNOSIS — E0590 Thyrotoxicosis, unspecified without thyrotoxic crisis or storm: Secondary | ICD-10-CM | POA: Diagnosis not present

## 2024-02-24 LAB — T4, FREE: Free T4: 1 ng/dL (ref 0.8–1.8)

## 2024-02-24 LAB — TSH: TSH: 0.8 m[IU]/L (ref 0.40–4.50)

## 2024-02-24 LAB — T3, FREE: T3, Free: 3.5 pg/mL (ref 2.3–4.2)

## 2024-02-24 NOTE — Progress Notes (Signed)
 Name: Delynda Sepulveda  MRN/ DOB: [id number], Aug 11, 1946    Age/ Sex: 78 y.o., female    PCP: Loring Tanda Mae, MD   Reason for Endocrinology Evaluation: Subclinical hyperthyroidism     Date of Initial Endocrinology Evaluation: 06/25/2023    HPI: Ms. Kora Groom is a 78 y.o. female with a past medical history of PAF, GERD, OA. The patient presented for initial endocrinology clinic visit on 06/25/2023 for consultative assistance with her subclinical hyperthyroidism.   Pt has been noted with low TSH and normal T4 and T3 since 2019 with a nadir of 0.022 uIU/ml in 06/2022  No hx of osteoporosis but has hx of low bone density  Pt with hx of paroxysmal A.Fib    No amiodarone or lithium   No Fh of thyroid  disease    On her initial visit to our clinic she was complaining of hair loss, fatigue, excessive sleeping during the day, pruritus of the skin, TSH worsened to 0.04u international unit /mL   I started her on methimazole  on initial visit    TRAb negative   Thyroid  ultrasound shows multiple nodules, none met FNA or serial monitoring criteria  SUBJECTIVE:    Today (02/24/24): Madalyne Gregsonis here for a follow up on hyperthyroidism.   Hair loss- improving  Fatigue- improving  Continues with tremors  She does have constipation and diarrhea  Heat intolerance has been improving  No local neck swelling  Biotin- no    Methimazole  5 mg daily   HISTORY:  Past Medical History:  Past Medical History:  Diagnosis Date   Adenomatous colon polyp 2003   Anxiety    Arthritis    Atrial fibrillation (HCC)    Chronic kidney disease    Depression    Dysrhythmia    Family history of adverse reaction to anesthesia    sister- problems waking up after anes. took several days to recover fully   Gallstones    GERD (gastroesophageal reflux disease)    Hepatic steatosis    History of hiatal hernia    Hyperlipidemia    IBS (irritable bowel syndrome)    Internal hemorrhoids     Irregular heart beat    Peptic ulcer disease    Rotator cuff disorder, left    TMJ (temporomandibular joint disorder)    Vitamin D deficiency    Past Surgical History:  Past Surgical History:  Procedure Laterality Date   ANAL RECTAL MANOMETRY N/A 11/22/2016   Procedure: ANO RECTAL MANOMETRY;  Surgeon: Gustav Shila GAILS, MD;  Location: [facility];  Service: Endoscopy;  Laterality: N/A;   BREAST BIOPSY Right 08/13/2022   MM RT BREAST BX W LOC DEV 1ST LESION IMAGE BX SPEC STEREO GUIDE 08/13/2022 [facility]   BREAST LUMPECTOMY Right    CHOLECYSTECTOMY     COLONOSCOPY     HAND SURGERY     3 hand surgeries, Left hand- trigger fingers x 2,  right hand x 1   KNEE ARTHROPLASTY Left 09/04/2022   Procedure: COMPUTER ASSISTED TOTAL KNEE ARTHROPLASTY;  Surgeon: Fidel Rogue, MD;  Location: [facility];  Service: Orthopedics;  Laterality: Left;  150   KNEE SURGERY     PARTIAL HYSTERECTOMY  1983   POLYPECTOMY     RECTOCELE AND CYSTOCELE REPAIR     TONSILLECTOMY      Social History:  reports that she quit smoking about 39 years ago. Her smoking use included cigarettes. She has never used smokeless tobacco. She reports that she does not drink  alcohol  and does not use drugs. Family History: family history includes Breast cancer in her sister; Colon cancer in her father, paternal aunt, and paternal uncle; Ovarian cancer in her mother; Uterine cancer in her mother.   HOME MEDICATIONS: Allergies as of 02/24/2024       Reactions   Amoxicillin-pot Clavulanate Nausea And Vomiting   None listed per [facility].   Penicillins Nausea And Vomiting   Patient denies        Medication List        Accurate as of February 24, 2024  1:42 PM. If you have any questions, ask your nurse or doctor.          apixaban  5 MG Tabs tablet Commonly known as: ELIQUIS  Take 5 mg by mouth 2 (two) times daily.   citalopram 20 MG tablet Commonly known as: CELEXA Take by mouth.    clindamycin 300 MG capsule Commonly known as: CLEOCIN TAKE 2 CAPSULES BY MOUTH 1 HOUR PRIOR TO DENTAL APPOINTMENT   furosemide  20 MG tablet Commonly known as: LASIX  Take 20 mg by mouth daily.   MAGNESIUM  PO Take 1 tablet by mouth daily as needed (leg cramps).   metFORMIN 850 MG tablet Commonly known as: GLUCOPHAGE Take 850 mg by mouth daily with breakfast.   methimazole  5 MG tablet Commonly known as: TAPAZOLE  Take 1 tablet (5 mg total) by mouth daily.   methocarbamol  500 MG tablet Commonly known as: ROBAXIN  TAKE 2 TABLETS BY MOUTH EVERY 6 TO 8 HOURS AS NEEDED   metoCLOPramide  10 MG tablet Commonly known as: Reglan  Take 1 tablets 20-30 minutes before drinking colonoscopy prep.   metoprolol  succinate 25 MG 24 hr tablet Commonly known as: TOPROL -XL Take 1 tablet (25 mg total) by mouth daily. What changed: how much to take   nitroGLYCERIN  0.4 MG SL tablet Commonly known as: NITROSTAT  Place 1 tablet (0.4 mg total) under the tongue every 5 (five) minutes as needed for chest pain.   omeprazole  40 MG capsule Commonly known as: PRILOSEC TAKE ONE CAPSULE BY MOUTH EVERY DAY   rosuvastatin  10 MG tablet Commonly known as: CRESTOR  Take 1 tablet (10 mg total) by mouth daily.   venlafaxine  XR 150 MG 24 hr capsule Commonly known as: EFFEXOR -XR Take 150 mg by mouth daily.          REVIEW OF SYSTEMS: A comprehensive ROS was conducted with the patient and is negative except as per HPI     OBJECTIVE:  VS: BP 134/70 (BP Location: Left Arm, Patient Position: Sitting, Cuff Size: Normal)   Pulse 72   Ht 5' 5 (1.651 m)   Wt 172 lb 9.6 oz (78.3 kg)   SpO2 99%   BMI 28.72 kg/m     Wt Readings from Last 3 Encounters:  02/24/24 172 lb 9.6 oz (78.3 kg)  12/10/23 176 lb (79.8 kg)  10/27/23 176 lb (79.8 kg)     EXAM: General: Pt appears well and is in NAD  Neck: General: Supple without adenopathy. Thyroid : Thyroid  size normal.  No goiter or nodules appreciated.   Lungs: Clear with good BS bilat   Heart: Auscultation: RRR.  Abdomen: Soft, nontender  Extremities:  BL LE: No pretibial edema   Mental Status: Judgment, insight: Intact Orientation: Oriented to time, place, and person Mood and affect: No depression, anxiety, or agitation     DATA REVIEWED:    Latest Reference Range & Units 02/24/24 13:52  TSH 0.40 - 4.50 mIU/L 0.80  Triiodothyronine,Free,Serum  2.3 - 4.2 pg/mL 3.5  T4,Free(Direct) 0.8 - 1.8 ng/dL 1.0      Latest Reference Range & Units 06/25/23 14:38  COMPREHENSIVE METABOLIC PANEL  Rpt !  Sodium 135 - 145 mEq/L 138  Potassium 3.5 - 5.1 mEq/L 4.2  Chloride 96 - 112 mEq/L 102  CO2 19 - 32 mEq/L 24  Glucose 70 - 99 mg/dL 889 (H)  BUN 6 - 23 mg/dL 19  Creatinine 9.59 - 8.79 mg/dL 9.18  Calcium  8.4 - 10.5 mg/dL 9.7  Alkaline Phosphatase 39 - 117 U/L 75  Albumin 3.5 - 5.2 g/dL 4.3  AST 0 - 37 U/L 23  ALT 0 - 35 U/L 17  Total Protein 6.0 - 8.3 g/dL 7.4  Total Bilirubin 0.2 - 1.2 mg/dL 0.2  GFR >39.99 mL/min 71.25    Latest Reference Range & Units 06/25/23 14:38  WBC 4.0 - 10.5 K/uL 13.7 (H)  RBC 3.87 - 5.11 Mil/uL 4.53  Hemoglobin 12.0 - 15.0 g/dL 88.1 (L)  HCT 63.9 - 53.9 % 38.0  MCV 78.0 - 100.0 fl 83.9  MCHC 30.0 - 36.0 g/dL 69.0  RDW 88.4 - 84.4 % 15.2  Platelets 150.0 - 400.0 K/uL 323.0  Glucose 70 - 99 mg/dL 889 (H)  TSH 9.64 - 4.49 uIU/mL 0.04 (L)  Triiodothyronine,Free,Serum 2.3 - 4.2 pg/mL 3.7  T4,Free(Direct) 0.60 - 1.60 ng/dL 9.25    Latest Reference Range & Units 06/25/23 14:38  TRAB <=2.00 IU/L <1.00   Thyroid  ultrasound 06/27/2023  Estimated total number of nodules >/= 1 cm: 6-10   Number of spongiform nodules >/=  2 cm not described below (TR1): 0   Number of mixed cystic and solid nodules >/= 1.5 cm not described below (TR2): 0   _________________________________________________________   There are multiple, nearly innumerable, mixed cystic and solid, spongiform/benign-appearing and  colloid containing nodules throughout the right lobe of the thyroid  the largest of which within the mid aspect of the right lobe of the thyroid  measuring 1.9 x 0.6 x 0.5 cm (labeled 3) though none of which meet imaging criteria to recommend percutaneous sampling or continued dedicated follow-up.   _________________________________________________________   Similar to the right lobe, there are multiple, nearly innumerable, mixed cystic and solid, spongiform/benign-appearing nodules throughout the left lobe of the thyroid , the largest of which measures 1.2 x 0.8 x 0.7 cm (labeled 7), and none of which meet criteria to recommend percutaneous sampling or continued dedicated follow-up.   IMPRESSION: 1. Findings compatible with multinodular goiter. 2. Multiple, nearly innumerable, mixed cystic and solid, spongiform/benign-appearing and colloid containing nodule throughout both the right and left lobes of the thyroid , none of which meet imaging criteria to recommend percutaneous sampling or continued dedicated follow-up.   ASSESSMENT/PLAN/RECOMMENDATIONS:   Hyperthyroidism :  -Most likely secondary to autonomous thyroid  nodules - Clinically she has been improving - TFT's normal   Medications : Continue methimazole  5 mg, 1 tablet daily   2.  Multinodular goiter:  -This is based on thyroid  ultrasound 06/2023, none of the nodules met FNA or follow-up criteria -This is the most likely reason for hyperthyroidism  - No local neck symptoms  Follow-up in 6 months    Signed electronically by: Stefano Redgie Butts, MD  [facility] Endocrinology  [facility] [address] [place] [place], [place] [address] Phone: [phone] FAX: [phone]   CC: Loring Tanda Mae, MD [address] [place] [address] Phone: [phone] Fax: [phone]   Return to Endocrinology clinic as below: Future  Appointments  Date Time Provider Department Center   02/25/2024  7:00 AM Nandigam, Gustav GAILS, MD [facility]

## 2024-02-25 ENCOUNTER — Encounter: Payer: Self-pay | Admitting: Gastroenterology

## 2024-02-25 ENCOUNTER — Ambulatory Visit: Admitting: Gastroenterology

## 2024-02-25 VITALS — BP 119/68 | HR 63 | Temp 97.7°F | Resp 14 | Ht 65.0 in | Wt 176.0 lb

## 2024-02-25 DIAGNOSIS — Z1211 Encounter for screening for malignant neoplasm of colon: Secondary | ICD-10-CM

## 2024-02-25 DIAGNOSIS — K5730 Diverticulosis of large intestine without perforation or abscess without bleeding: Secondary | ICD-10-CM | POA: Diagnosis not present

## 2024-02-25 DIAGNOSIS — K648 Other hemorrhoids: Secondary | ICD-10-CM | POA: Diagnosis not present

## 2024-02-25 DIAGNOSIS — K644 Residual hemorrhoidal skin tags: Secondary | ICD-10-CM | POA: Diagnosis not present

## 2024-02-25 DIAGNOSIS — K635 Polyp of colon: Secondary | ICD-10-CM

## 2024-02-25 DIAGNOSIS — D123 Benign neoplasm of transverse colon: Secondary | ICD-10-CM

## 2024-02-25 DIAGNOSIS — Z860101 Personal history of adenomatous and serrated colon polyps: Secondary | ICD-10-CM

## 2024-02-25 MED ORDER — SODIUM CHLORIDE 0.9 % IV SOLN: 500.0000 mL | Freq: Once | INTRAVENOUS | Status: DC

## 2024-02-25 NOTE — Patient Instructions (Signed)
 You may resume your Eliquis  tomorrow at the previous dose. You may resume all of your previous medications as ordered. Read the handouts given to you by your recovery room nurse.   YOU HAD AN ENDOSCOPIC PROCEDURE TODAY AT THE [facility]:   Refer to the procedure report that was given to you for any specific questions about what was found during the examination.  If the procedure report does not answer your questions, please call your gastroenterologist to clarify.  If you requested that your care partner not be given the details of your procedure findings, then the procedure report has been included in a sealed envelope for you to review at your convenience later.  YOU SHOULD EXPECT: Some feelings of bloating in the abdomen. Passage of more gas than usual.  Walking can help get rid of the air that was put into your GI tract during the procedure and reduce the bloating. If you had a lower endoscopy (such as a colonoscopy or flexible sigmoidoscopy) you may notice spotting of blood in your stool or on the toilet paper. If you underwent a bowel prep for your procedure, you may not have a normal bowel movement for a few days.  Please Note:  You might notice some irritation and congestion in your nose or some drainage.  This is from the oxygen used during your procedure.  There is no need for concern and it should clear up in a day or so.  SYMPTOMS TO REPORT IMMEDIATELY:  Following lower endoscopy (colonoscopy or flexible sigmoidoscopy):  Excessive amounts of blood in the stool  Significant tenderness or worsening of abdominal pains  Swelling of the abdomen that is new, acute  Fever of 100F or higher   For urgent or emergent issues, a gastroenterologist can be reached at any hour by calling [phone]. Do not use MyChart messaging for urgent concerns.    DIET:  We do recommend a small meal at first, but then you may proceed to your regular diet.  Drink plenty of fluids but you  should avoid alcoholic beverages for 24 hours.  ACTIVITY:  You should plan to take it easy for the rest of today and you should NOT DRIVE or use heavy machinery until tomorrow (because of the sedation medicines used during the test).    FOLLOW UP: Our staff will call the number listed on your records the next business day following your procedure.  We will call around 7:15- 8:00 am to check on you and address any questions or concerns that you may have regarding the information given to you following your procedure. If we do not reach you, we will leave a message.     If any biopsies were taken you will be contacted by phone or by letter within the next 1-3 weeks.  Please call us  at [phone] if you have not heard about the biopsies in 3 weeks.    SIGNATURES/CONFIDENTIALITY: You and/or your care partner have signed paperwork which will be entered into your electronic medical record.  These signatures attest to the fact that that the information above on your After Visit Summary has been reviewed and is understood.  Full responsibility of the confidentiality of this discharge information lies with you and/or your care-partner.

## 2024-02-25 NOTE — Op Note (Addendum)
 [facility] Patient Name: Dorothy Mcdonald Procedure Date: 02/25/2024 7:24 AM MRN: [id number] Endoscopist: Gustav ALONSO Mcgee , MD, [id number] Age: 78 Referring MD:  Date of Birth: 02/10/1946 Gender: Female Account #: [id number] Procedure:                Colonoscopy Indications:              High risk colon cancer surveillance: Personal                            history of adenoma (10 mm or greater in size), High                            risk colon cancer surveillance: Personal history of                            multiple (3 or more) adenomas Medicines:                Monitored Anesthesia Care Procedure:                Pre-Anesthesia Assessment:                           - Prior to the procedure, a History and Physical                            was performed, and patient medications and                            allergies were reviewed. The patient's tolerance of                            previous anesthesia was also reviewed. The risks                            and benefits of the procedure and the sedation                            options and risks were discussed with the patient.                            All questions were answered, and informed consent                            was obtained. Prior Anticoagulants: The patient                            last took Eliquis  (apixaban ) 2 days prior to the                            procedure. ASA Grade Assessment: III - A patient                            with severe systemic disease. After reviewing the  risks and benefits, the patient was deemed in                            satisfactory condition to undergo the procedure.                           After obtaining informed consent, the colonoscope                            was passed under direct vision. Throughout the                            procedure, the patient's blood pressure, pulse, and                            oxygen  saturations were monitored continuously. The                            Olympus Scope SN: [id number] was introduced through                            the anus and advanced to the the cecum, identified                            by appendiceal orifice and ileocecal valve. The                            colonoscopy was performed without difficulty. The                            patient tolerated the procedure well. The quality                            of the bowel preparation was good. The ileocecal                            valve, appendiceal orifice, and rectum were                            photographed. Scope In: 8:16:08 AM Scope Out: 8:49:44 AM Scope Withdrawal Time: 0 hours 28 minutes 39 seconds  Total Procedure Duration: 0 hours 33 minutes 36 seconds  Findings:                 The perianal and digital rectal examinations were                            normal.                           A 5 mm polyp was found in the cecum. The polyp was                            sessile. The polyp was removed with a cold snare.  Resection was complete, but the polyp tissue was                            not retrieved.                           An 8 mm polyp was found in the transverse colon.                            The polyp was semi-pedunculated. The polyp was                            removed with a hot snare. Resection and retrieval                            were complete.                           A few small-mouthed diverticula were found in the                            sigmoid colon.                           Non-bleeding external and internal hemorrhoids were                            found during retroflexion. The hemorrhoids were                            medium-sized. Complications:            No immediate complications. Estimated Blood Loss:     Estimated blood loss was minimal. Impression:               - One 5 mm polyp in the cecum, removed with a  cold                            snare. Complete resection. Polyp tissue not                            retrieved.                           - One 8 mm polyp in the transverse colon, removed                            with a hot snare. Resected and retrieved.                           - Diverticulosis in the sigmoid colon.                           - Non-bleeding external and internal hemorrhoids. Recommendation:           - Patient has a contact number available for  emergencies. The signs and symptoms of potential                            delayed complications were discussed with the                            patient. Return to normal activities tomorrow.                            Written discharge instructions were provided to the                            patient.                           - Resume previous diet.                           - Continue present medications.                           - Await pathology results.                           - No repeat colonoscopy due to age.                           - Resume Eliquis  (apixaban ) at prior dose tomorrow. Makailee Nudelman V. Talya Quain, MD 02/25/2024 8:56:48 AM This report has been signed electronically.

## 2024-02-25 NOTE — Progress Notes (Unsigned)
 Pt A/O x 3, gd SR's, pleased with anesthesia, report to RN

## 2024-02-25 NOTE — Progress Notes (Signed)
 Called to room to assist during endoscopic procedure.  Patient ID and intended procedure confirmed with present staff. Received instructions for my participation in the procedure from the performing physician.

## 2024-02-25 NOTE — Progress Notes (Unsigned)
 [facility] History and Physical   Primary Care Physician:  Loring Tanda Mae, MD   Reason for Procedure:  History of adenomatous colon polyps  Plan:    Surveillance colonoscopy with possible interventions as needed     HPI: Dorothy Mcdonald is a very pleasant 78 y.o. female here for surveillance colonoscopy. Denies any nausea, vomiting, abdominal pain, melena or bright red blood per rectum  The risks and benefits as well as alternatives of endoscopic procedure(s) have been discussed and reviewed. All questions answered. The patient agrees to proceed.    Past Medical History:  Diagnosis Date   Adenomatous colon polyp 2003   Anxiety    Arthritis    Atrial fibrillation (HCC)    Chronic kidney disease    Depression    Diabetes mellitus without complication (HCC)    Dysrhythmia    Family history of adverse reaction to anesthesia    sister- problems waking up after anes. took several days to recover fully   Gallstones    GERD (gastroesophageal reflux disease)    Hepatic steatosis    History of hiatal hernia    Hyperlipidemia    IBS (irritable bowel syndrome)    Internal hemorrhoids    Irregular heart beat    Peptic ulcer disease    Rotator cuff disorder, left    TMJ (temporomandibular joint disorder)    Vitamin D deficiency     Past Surgical History:  Procedure Laterality Date   ANAL RECTAL MANOMETRY N/A 11/22/2016   Procedure: ANO RECTAL MANOMETRY;  Surgeon: Gustav Shila GAILS, MD;  Location: [facility];  Service: Endoscopy;  Laterality: N/A;   BREAST BIOPSY Right 08/13/2022   MM RT BREAST BX W LOC DEV 1ST LESION IMAGE BX SPEC STEREO GUIDE 08/13/2022 [facility]   BREAST LUMPECTOMY Right    CHOLECYSTECTOMY     COLONOSCOPY     HAND SURGERY     3 hand surgeries, Left hand- trigger fingers x 2,  right hand x 1   KNEE ARTHROPLASTY Left 09/04/2022   Procedure: COMPUTER ASSISTED TOTAL KNEE ARTHROPLASTY;  Surgeon: Fidel Rogue, MD;  Location: [facility];  Service: Orthopedics;  Laterality: Left;  150   KNEE SURGERY     PARTIAL HYSTERECTOMY  1983   POLYPECTOMY     RECTOCELE AND CYSTOCELE REPAIR     TONSILLECTOMY      Prior to Admission medications   Medication Sig Start Date End Date Taking? Authorizing Provider  citalopram (CELEXA) 20 MG tablet Take by mouth. 12/24/18  Yes [provider]  famotidine  (PEPCID ) 20 MG tablet Take 20-40 mg by mouth daily as needed. 11/04/23  Yes [provider]  MAGNESIUM  PO Take 1 tablet by mouth daily as needed (leg cramps).   Yes [provider]  metFORMIN (GLUCOPHAGE) 850 MG tablet Take 850 mg by mouth daily with breakfast. 05/02/23  Yes [provider]  methimazole  (TAPAZOLE ) 5 MG tablet Take 1 tablet (5 mg total) by mouth daily. 11/14/23  Yes Shamleffer, Ibtehal Jaralla, MD  metoprolol  succinate (TOPROL -XL) 25 MG 24 hr tablet Take 1 tablet (25 mg total) by mouth daily. Patient taking differently: Take 50 mg by mouth daily. 02/27/21  Yes Danton Reyes DASEN, MD  rosuvastatin  (CRESTOR ) 10 MG tablet Take 1 tablet (10 mg total) by mouth daily. 02/27/21  Yes Danton Reyes DASEN, MD  venlafaxine  XR (EFFEXOR -XR) 150 MG 24 hr capsule Take 150 mg by mouth daily. 08/09/19  Yes [provider]  apixaban  (ELIQUIS ) 5 MG  TABS tablet Take 5 mg by mouth 2 (two) times daily. 07/02/19 06/25/23  [provider]  clindamycin (CLEOCIN) 300 MG capsule TAKE 2 CAPSULES BY MOUTH 1 HOUR PRIOR TO DENTAL APPOINTMENT    [provider]  furosemide  (LASIX ) 20 MG tablet Take 20 mg by mouth daily. 12/18/17   [provider]  methocarbamol  (ROBAXIN ) 500 MG tablet TAKE 2 TABLETS BY MOUTH EVERY 6 TO 8 HOURS AS NEEDED    [provider]  metoCLOPramide  (REGLAN ) 10 MG tablet Take 1 tablets 20-30 minutes before drinking colonoscopy prep. Patient not taking: Reported on 02/25/2024 02/16/24   Beather Delon Gibson, PA  nitroGLYCERIN  (NITROSTAT ) 0.4 MG SL tablet Place 1  tablet (0.4 mg total) under the tongue every 5 (five) minutes as needed for chest pain. Patient not taking: Reported on 02/25/2024 02/26/21 08/22/22  Elmira Newman PARAS, MD  omeprazole  (PRILOSEC) 40 MG capsule TAKE ONE CAPSULE BY MOUTH EVERY DAY Patient not taking: Reported on 02/24/2024 03/11/22   Dalbert Stillings V, MD    Current Outpatient Medications  Medication Sig Dispense Refill   citalopram (CELEXA) 20 MG tablet Take by mouth.     famotidine  (PEPCID ) 20 MG tablet Take 20-40 mg by mouth daily as needed.     MAGNESIUM  PO Take 1 tablet by mouth daily as needed (leg cramps).     metFORMIN (GLUCOPHAGE) 850 MG tablet Take 850 mg by mouth daily with breakfast.     methimazole  (TAPAZOLE ) 5 MG tablet Take 1 tablet (5 mg total) by mouth daily. 90 tablet 3   metoprolol  succinate (TOPROL -XL) 25 MG 24 hr tablet Take 1 tablet (25 mg total) by mouth daily. (Patient taking differently: Take 50 mg by mouth daily.) 30 tablet 0   rosuvastatin  (CRESTOR ) 10 MG tablet Take 1 tablet (10 mg total) by mouth daily. 30 tablet 0   venlafaxine  XR (EFFEXOR -XR) 150 MG 24 hr capsule Take 150 mg by mouth daily.     apixaban  (ELIQUIS ) 5 MG TABS tablet Take 5 mg by mouth 2 (two) times daily.     clindamycin (CLEOCIN) 300 MG capsule TAKE 2 CAPSULES BY MOUTH 1 HOUR PRIOR TO DENTAL APPOINTMENT     furosemide  (LASIX ) 20 MG tablet Take 20 mg by mouth daily.     methocarbamol  (ROBAXIN ) 500 MG tablet TAKE 2 TABLETS BY MOUTH EVERY 6 TO 8 HOURS AS NEEDED     metoCLOPramide  (REGLAN ) 10 MG tablet Take 1 tablets 20-30 minutes before drinking colonoscopy prep. (Patient not taking: Reported on 02/25/2024) 2 tablet 0   nitroGLYCERIN  (NITROSTAT ) 0.4 MG SL tablet Place 1 tablet (0.4 mg total) under the tongue every 5 (five) minutes as needed for chest pain. (Patient not taking: Reported on 02/25/2024) 30 tablet 1   omeprazole  (PRILOSEC) 40 MG capsule TAKE ONE CAPSULE BY MOUTH EVERY DAY (Patient not taking: Reported on 02/24/2024) 90 capsule  3   Current Facility-Administered Medications  Medication Dose Route Frequency Provider Last Rate Last Admin   0.9 %  sodium chloride  infusion  500 mL Intravenous Once Genavie Boettger V, MD        Allergies as of 02/25/2024 - Review Complete 02/25/2024  Allergen Reaction Noted   Amoxicillin-pot clavulanate Nausea And Vomiting 11/19/2016   Penicillins Nausea And Vomiting 08/31/2016    Family History  Problem Relation Age of Onset   Colon cancer Father        DIED AT 20   Colon cancer Paternal Aunt    Colon cancer Paternal Uncle  x 3   Ovarian cancer Mother    Uterine cancer Mother    Breast cancer Sister    Esophageal cancer Neg Hx    Rectal cancer Neg Hx    Stomach cancer Neg Hx     Social History   Socioeconomic History   Marital status: Married    Spouse name: Not on file   Number of children: 4   Years of education: Not on file   Highest education level: Not on file  Occupational History   Occupation: retired  Tobacco Use   Smoking status: Former    Current packs/day: 0.00    Types: Cigarettes    Quit date: 09/02/1984    Years since quitting: 39.5   Smokeless tobacco: Never  Vaping Use   Vaping status: Never Used  Substance and Sexual Activity   Alcohol  use: No   Drug use: No   Sexual activity: Yes    Birth control/protection: Post-menopausal  Other Topics Concern   Not on file  Social History Narrative   Not on file   Social Drivers of Health   Financial Resource Strain: Not on file  Food Insecurity: No Food Insecurity (09/04/2022)   Hunger Vital Sign    Worried About Running Out of Food in the Last Year: Never true    Ran Out of Food in the Last Year: Never true  Transportation Needs: No Transportation Needs (09/04/2022)   PRAPARE - Administrator, Civil Service (Medical): No    Lack of Transportation (Non-Medical): No  Physical Activity: Not on file  Stress: Not on file  Social Connections: Not on file  Intimate Partner  Violence: Not At Risk (09/04/2022)   Humiliation, Afraid, Rape, and Kick questionnaire    Fear of Current or Ex-Partner: No    Emotionally Abused: No    Physically Abused: No    Sexually Abused: No    Review of Systems:  All other review of systems negative except as mentioned in the HPI.  Physical Exam: Vital signs in last 24 hours: BP (!) 143/65   Pulse 71   Temp 97.7 F (36.5 C) (Temporal)   Resp 10   Ht 5' 5 (1.651 m)   Wt 176 lb (79.8 kg)   SpO2 98%   BMI 29.29 kg/m  General:   Alert, NAD Lungs:  Clear .   Heart:  Regular rate and rhythm Abdomen:  Soft, nontender and nondistended. Neuro/Psych:  Alert and cooperative. Normal mood and affect. A and O x 3  Reviewed labs, radiology imaging, old records and pertinent past GI work up  Patient is appropriate for planned procedure(s) and anesthesia in an ambulatory setting   K. Veena Yareni Creps , MD [phone]

## 2024-02-26 ENCOUNTER — Telehealth: Payer: Self-pay | Admitting: *Deleted

## 2024-02-26 NOTE — Telephone Encounter (Signed)
 Attempted post procedure follow up call.  No answer - LVM.

## 2024-02-27 ENCOUNTER — Ambulatory Visit: Payer: Self-pay | Admitting: Internal Medicine

## 2024-02-27 LAB — SURGICAL PATHOLOGY

## 2024-02-27 MED ORDER — METHIMAZOLE 5 MG PO TABS: 5.0000 mg | ORAL_TABLET | Freq: Every day | ORAL | 3 refills | Status: AC

## 2024-03-10 DIAGNOSIS — R300 Dysuria: Secondary | ICD-10-CM | POA: Diagnosis not present

## 2024-03-18 ENCOUNTER — Ambulatory Visit (INDEPENDENT_AMBULATORY_CARE_PROVIDER_SITE_OTHER)

## 2024-03-18 ENCOUNTER — Ambulatory Visit: Admitting: Podiatry

## 2024-03-18 ENCOUNTER — Encounter: Payer: Self-pay | Admitting: Podiatry

## 2024-03-18 DIAGNOSIS — M779 Enthesopathy, unspecified: Secondary | ICD-10-CM

## 2024-03-18 DIAGNOSIS — M722 Plantar fascial fibromatosis: Secondary | ICD-10-CM | POA: Diagnosis not present

## 2024-03-18 MED ORDER — TRIAMCINOLONE ACETONIDE 10 MG/ML IJ SUSP
10.0000 mg | Freq: Once | INTRAMUSCULAR | Status: AC
  Administered 2024-03-18: 10 mg via INTRA_ARTICULAR

## 2024-03-18 NOTE — Patient Instructions (Signed)

## 2024-03-18 NOTE — Progress Notes (Signed)
 Subjective:   Patient ID: Dorothy Mcdonald, female   DOB: 78 y.o.   MRN: [id number]   HPI Patient presents with exquisite discomfort plantar aspect heel region right over left along with pain in the dorsal lateral aspect of the right foot of the more recent duration.  States the heels been hurting for at least 3 months and the other pain for about a month.  Patient does not smoke likes to be active   Review of Systems  All other systems reviewed and are negative.       Objective:  Physical Exam Vitals and nursing note reviewed.  Constitutional:      Appearance: She is well-developed.  Pulmonary:     Effort: Pulmonary effort is normal.  Musculoskeletal:        General: Normal range of motion.  Skin:    General: Skin is warm.  Neurological:     Mental Status: She is alert.     Neurovascular status intact muscle strength found to be adequate range of motion within normal limits.  Patient is found to have exquisite discomfort medial fascial band heel region right over left with what appears to be compensatory discomfort dorsal lateral aspect right foot mildly tender when pressed.  Good digital perfusion well-oriented x 3     Assessment:  Acute plantar fasciitis right over left with dorsal tendinitis left     Plan:  H&P reviewed at this point and focusing on the heel sterile prep injected the fascia at insertion bilateral heels 3 mg Kenalog  5 mg Xylocaine  applied sterile dressings and for the right applied fascial brace to lift up the arch instructed on ice reappoint 2 weeks to recheck  X-rays indicated small spur no indication of stress fracture or arthritis condition

## 2024-04-01 ENCOUNTER — Ambulatory Visit: Admitting: Podiatry

## 2024-04-01 ENCOUNTER — Encounter: Payer: Self-pay | Admitting: Podiatry

## 2024-04-01 DIAGNOSIS — M722 Plantar fascial fibromatosis: Secondary | ICD-10-CM

## 2024-04-01 NOTE — Progress Notes (Signed)
 Subjective:   Patient ID: Dorothy Mcdonald, female   DOB: 78 y.o.   MRN: [id number]   HPI Patient presents stating I feel much better her right heel has improved and I am walking with a better heel-toe gait and my knee is now hurting right I may need to get injection   ROS      Objective:  Physical Exam  Neurovascular status intact with significant diminishment of discomfort in the plantar fascia right and left foot with fluid buildup of the minimal nature     Assessment:  Significant improvement plantar fascial condition right over left     Plan:  H&P reviewed condition discussed continued stretching continued shoe gear modifications and hopefully this will be the end of the problem.  Did discuss that we will see her back if symptoms were to recur

## 2024-04-21 ENCOUNTER — Ambulatory Visit: Payer: Self-pay | Admitting: Gastroenterology

## 2024-05-18 ENCOUNTER — Encounter: Payer: Self-pay | Admitting: Internal Medicine

## 2024-05-18 ENCOUNTER — Ambulatory Visit: Admitting: Internal Medicine

## 2024-05-18 VITALS — BP 122/74 | HR 96 | Ht 65.0 in | Wt 174.2 lb

## 2024-05-18 DIAGNOSIS — E042 Nontoxic multinodular goiter: Secondary | ICD-10-CM

## 2024-05-18 DIAGNOSIS — E0590 Thyrotoxicosis, unspecified without thyrotoxic crisis or storm: Secondary | ICD-10-CM

## 2024-05-18 NOTE — Progress Notes (Unsigned)
 Name: Dorothy Mcdonald  MRN/ DOB: [id number], [birth year]    Age/ Sex: 78 y.o., female    PCP: Loring Tanda Mae, MD   Reason for Endocrinology Evaluation: Subclinical hyperthyroidism     Date of Initial Endocrinology Evaluation: 06/25/2023    HPI: Dorothy Mcdonald is a 78 y.o. female with a past medical history of PAF, GERD, OA. The patient presented for initial endocrinology clinic visit on 06/25/2023 for consultative assistance with her subclinical hyperthyroidism.   Pt has been noted with low TSH and normal T4 and T3 since 2019 with a nadir of 0.022 uIU/ml in 06/2022  No hx of osteoporosis but has hx of low bone density  Pt with hx of paroxysmal A.Fib    No amiodarone or lithium   No Fh of thyroid  disease    On her initial visit to our clinic she was complaining of hair loss, fatigue, excessive sleeping during the day, pruritus of the skin, TSH worsened to 0.04u international unit /mL   I started her on methimazole  on initial visit    TRAb negative   Thyroid  ultrasound shows multiple nodules, none met FNA or serial monitoring criteria  SUBJECTIVE:    Today (05/18/24): Dorothy Mcdonald here for a follow up on hyperthyroidism.  Patient is complaining of not feeling well, patient states she feels she is  in a storm She also states that she has been doing a lot of reading  Patient has noted new symptoms over the past month, similar to prior to her diagnosis with thyroid  disease. Patient has noted insomnia, vivid dreams, excessive sweating, brain fog, palpitations, hair loss, tremors she had an episode near syncope where everything went great while making lunch, BP at the time 127 bpm.  She has an upcoming appointment with cardiology Weight has been stable overall  She also has dull throat pain , headaches, migraine type.  She had a nosebleed this morning She also has noted an episode of hives approximately 3 weeks ago Continues to have hair loss No  biotin   Methimazole  5 mg daily   HISTORY:  Past Medical History:  Past Medical History:  Diagnosis Date   Adenomatous colon polyp 2003   Anxiety    Arthritis    Atrial fibrillation (HCC)    Chronic kidney disease    Depression    Diabetes mellitus without complication (HCC)    Dysrhythmia    Family history of adverse reaction to anesthesia    sister- problems waking up after anes. took several days to recover fully   Gallstones    GERD (gastroesophageal reflux disease)    Hepatic steatosis    History of hiatal hernia    Hyperlipidemia    IBS (irritable bowel syndrome)    Internal hemorrhoids    Irregular heart beat    Peptic ulcer disease    Rotator cuff disorder, left    TMJ (temporomandibular joint disorder)    Vitamin D deficiency    Past Surgical History:  Past Surgical History:  Procedure Laterality Date   ANAL RECTAL MANOMETRY N/A 11/22/2016   Procedure: ANO RECTAL MANOMETRY;  Surgeon: Gustav Shila GAILS, MD;  Location: [facility];  Service: Endoscopy;  Laterality: N/A;   BREAST BIOPSY Right 08/13/2022   MM RT BREAST BX W LOC DEV 1ST LESION IMAGE BX SPEC STEREO GUIDE 08/13/2022 [facility]   BREAST LUMPECTOMY Right    CHOLECYSTECTOMY     COLONOSCOPY     HAND SURGERY     3 hand  surgeries, Left hand- trigger fingers x 2,  right hand x 1   KNEE ARTHROPLASTY Left 09/04/2022   Procedure: COMPUTER ASSISTED TOTAL KNEE ARTHROPLASTY;  Surgeon: Fidel Rogue, MD;  Location: [facility];  Service: Orthopedics;  Laterality: Left;  150   KNEE SURGERY     PARTIAL HYSTERECTOMY  1983   POLYPECTOMY     RECTOCELE AND CYSTOCELE REPAIR     TONSILLECTOMY      Social History:  reports that she quit smoking about 39 years ago. Her smoking use included cigarettes. She has never used smokeless tobacco. She reports that she does not drink alcohol  and does not use drugs. Family History: family history includes Breast cancer in her sister; Colon cancer in her father, paternal  aunt, and paternal uncle; Ovarian cancer in her mother; Uterine cancer in her mother.   HOME MEDICATIONS: Allergies as of 05/18/2024       Reactions   Amoxicillin-pot Clavulanate Nausea And Vomiting   None listed per [facility].   Penicillins Nausea And Vomiting   Patient denies        Medication List        Accurate as of May 18, 2024  7:19 AM. If you have any questions, ask your nurse or doctor.          apixaban  5 MG Tabs tablet Commonly known as: ELIQUIS  Take 5 mg by mouth 2 (two) times daily.   citalopram 20 MG tablet Commonly known as: CELEXA Take by mouth.   clindamycin 300 MG capsule Commonly known as: CLEOCIN TAKE 2 CAPSULES BY MOUTH 1 HOUR PRIOR TO DENTAL APPOINTMENT   famotidine  20 MG tablet Commonly known as: PEPCID  Take 20-40 mg by mouth daily as needed.   furosemide  20 MG tablet Commonly known as: LASIX  Take 20 mg by mouth daily.   MAGNESIUM  PO Take 1 tablet by mouth daily as needed (leg cramps).   metFORMIN 850 MG tablet Commonly known as: GLUCOPHAGE Take 850 mg by mouth daily with breakfast.   methimazole  5 MG tablet Commonly known as: TAPAZOLE  Take 1 tablet (5 mg total) by mouth daily.   methocarbamol  500 MG tablet Commonly known as: ROBAXIN  TAKE 2 TABLETS BY MOUTH EVERY 6 TO 8 HOURS AS NEEDED   metoCLOPramide  10 MG tablet Commonly known as: Reglan  Take 1 tablets 20-30 minutes before drinking colonoscopy prep.   metoprolol  succinate 25 MG 24 hr tablet Commonly known as: TOPROL -XL Take 1 tablet (25 mg total) by mouth daily. What changed: how much to take   nitroGLYCERIN  0.4 MG SL tablet Commonly known as: NITROSTAT  Place 1 tablet (0.4 mg total) under the tongue every 5 (five) minutes as needed for chest pain.   omeprazole  40 MG capsule Commonly known as: PRILOSEC TAKE ONE CAPSULE BY MOUTH EVERY DAY   rosuvastatin  10 MG tablet Commonly known as: CRESTOR  Take 1 tablet (10 mg total) by mouth daily.    venlafaxine  XR 150 MG 24 hr capsule Commonly known as: EFFEXOR -XR Take 150 mg by mouth daily.          REVIEW OF SYSTEMS: A comprehensive ROS was conducted with the patient and is negative except as per HPI     OBJECTIVE:  VS: There were no vitals taken for this visit.    Wt Readings from Last 3 Encounters:  02/25/24 176 lb (79.8 kg)  02/24/24 172 lb 9.6 oz (78.3 kg)  12/10/23 176 lb (79.8 kg)     EXAM: General: Pt appears well and  is in NAD  Neck: General: Supple without adenopathy. Thyroid : Thyroid  size normal.  No goiter or nodules appreciated.  Lungs: Clear with good BS bilat   Heart: Auscultation: RRR.  Abdomen: Soft, nontender  Extremities:  BL LE: No pretibial edema   Mental Status: Judgment, insight: Intact Orientation: Oriented to time, place, and person Mood and affect: No depression, anxiety, or agitation     DATA REVIEWED:    Latest Reference Range & Units 02/24/24 13:52  TSH 0.40 - 4.50 mIU/L 0.80  Triiodothyronine,Free,Serum 2.3 - 4.2 pg/mL 3.5  T4,Free(Direct) 0.8 - 1.8 ng/dL 1.0      Latest Reference Range & Units 06/25/23 14:38  COMPREHENSIVE METABOLIC PANEL  Rpt !  Sodium 135 - 145 mEq/L 138  Potassium 3.5 - 5.1 mEq/L 4.2  Chloride 96 - 112 mEq/L 102  CO2 19 - 32 mEq/L 24  Glucose 70 - 99 mg/dL 889 (H)  BUN 6 - 23 mg/dL 19  Creatinine 9.59 - 8.79 mg/dL 9.18  Calcium  8.4 - 10.5 mg/dL 9.7  Alkaline Phosphatase 39 - 117 U/L 75  Albumin 3.5 - 5.2 g/dL 4.3  AST 0 - 37 U/L 23  ALT 0 - 35 U/L 17  Total Protein 6.0 - 8.3 g/dL 7.4  Total Bilirubin 0.2 - 1.2 mg/dL 0.2  GFR >39.99 mL/min 71.25     Latest Reference Range & Units 06/25/23 14:38  TRAB <=2.00 IU/L <1.00   Thyroid  ultrasound 06/27/2023  Estimated total number of nodules >/= 1 cm: 6-10   Number of spongiform nodules >/=  2 cm not described below (TR1): 0   Number of mixed cystic and solid nodules >/= 1.5 cm not described below (TR2): 0    _________________________________________________________   There are multiple, nearly innumerable, mixed cystic and solid, spongiform/benign-appearing and colloid containing nodules throughout the right lobe of the thyroid  the largest of which within the mid aspect of the right lobe of the thyroid  measuring 1.9 x 0.6 x 0.5 cm (labeled 3) though none of which meet imaging criteria to recommend percutaneous sampling or continued dedicated follow-up.   _________________________________________________________   Similar to the right lobe, there are multiple, nearly innumerable, mixed cystic and solid, spongiform/benign-appearing nodules throughout the left lobe of the thyroid , the largest of which measures 1.2 x 0.8 x 0.7 cm (labeled 7), and none of which meet criteria to recommend percutaneous sampling or continued dedicated follow-up.   IMPRESSION: 1. Findings compatible with multinodular goiter. 2. Multiple, nearly innumerable, mixed cystic and solid, spongiform/benign-appearing and colloid containing nodule throughout both the right and left lobes of the thyroid , none of which meet imaging criteria to recommend percutaneous sampling or continued dedicated follow-up.   ASSESSMENT/PLAN/RECOMMENDATIONS:   Hyperthyroidism :  -Patient with multiple symptoms - Most likely secondary to autonomous thyroid  nodules - Clinically she has been improving -   Medications : Continue methimazole  5 mg, 1 tablet daily   2.  Multinodular goiter:  -This is based on thyroid  ultrasound 06/2023, none of the nodules met FNA or follow-up criteria -This is the most likely reason for hyperthyroidism  - She has noted dullness in her throat, but I suspect this may be allergies over mental as she also has noted nosebleed this morning, an episode of hives a few weeks ago and recent headaches    Patient encouraged to keep her appointment with cardiology next month She was also encouraged to  follow-up with her PCP   Signed electronically by: Stefano Redgie Butts, MD  [facility]  [facility] [address], [address] [place], [place] [address] Phone: [phone] FAX: [phone]   CC: Loring Tanda Mae, MD [address] [place] [address] Phone: [phone] Fax: [phone]   Return to Endocrinology clinic as below: Future Appointments  Date Time Provider Department Center  05/18/2024 10:10 AM Krithika Tome, Donell Cardinal, MD [facility] None  08/31/2024  1:00 PM Keyarah Mcroy, Donell Cardinal, MD [facility] None

## 2024-05-19 ENCOUNTER — Ambulatory Visit: Payer: Self-pay | Admitting: Internal Medicine

## 2024-05-19 LAB — TSH: TSH: 1.05 m[IU]/L (ref 0.40–4.50)

## 2024-05-19 LAB — T4, FREE: Free T4: 1 ng/dL (ref 0.8–1.8)

## 2024-05-19 LAB — T3, FREE: T3, Free: 3 pg/mL (ref 2.3–4.2)

## 2024-06-22 DIAGNOSIS — K080 Exfoliation of teeth due to systemic causes: Secondary | ICD-10-CM | POA: Diagnosis not present

## 2024-08-31 ENCOUNTER — Ambulatory Visit: Admitting: Internal Medicine

## 2024-09-21 ENCOUNTER — Other Ambulatory Visit

## 2024-09-21 ENCOUNTER — Encounter: Payer: Self-pay | Admitting: Internal Medicine

## 2024-09-21 ENCOUNTER — Ambulatory Visit: Admitting: Internal Medicine

## 2024-09-21 VITALS — BP 110/60 | HR 78 | Ht 65.0 in | Wt 182.0 lb

## 2024-09-21 DIAGNOSIS — E042 Nontoxic multinodular goiter: Secondary | ICD-10-CM | POA: Diagnosis not present

## 2024-09-21 DIAGNOSIS — E0590 Thyrotoxicosis, unspecified without thyrotoxic crisis or storm: Secondary | ICD-10-CM

## 2024-09-21 NOTE — Progress Notes (Unsigned)
 "   Name: Dorothy Mcdonald  MRN/ DOB: [id number], 1945/10/05    Age/ Sex: 79 y.o., female    PCP: Loring Tanda Mae, MD   Reason for Endocrinology Evaluation: Subclinical hyperthyroidism     Date of Initial Endocrinology Evaluation: 06/25/2023    HPI: Dorothy Mcdonald is a 79 y.o. female with a past medical history of PAF, GERD, OA. The patient presented for initial endocrinology clinic visit on 06/25/2023 for consultative assistance with her subclinical hyperthyroidism.   Pt has been noted with low TSH and normal T4 and T3 since 2019 with a nadir of 0.022 uIU/ml in 06/2022  No hx of osteoporosis but has hx of low bone density  Pt with hx of paroxysmal A.Fib    No amiodarone or lithium   No Fh of thyroid  disease    On her initial visit to our clinic she was complaining of hair loss, fatigue, excessive sleeping during the day, pruritus of the skin, TSH worsened to 0.04u international unit /mL   I started her on methimazole  on initial visit    TRAb negative   Thyroid  ultrasound shows multiple nodules, none met FNA or serial monitoring criteria  SUBJECTIVE:    Today (09/21/24): Dorothy Gregsonis here for a follow up on hyperthyroidism.     She continues to follow up with cardiology for A. Fib  Pt has been noted with weight gain  No palpitations , metoprolol  dose has been reduced by 50% due to fatigue No local neck swelling  Continues with tremors  Continues with changes in bowel movements   Methimazole  5 mg daily   HISTORY:  Past Medical History:  Past Medical History:  Diagnosis Date   Adenomatous colon polyp 2003   Anxiety    Arthritis    Atrial fibrillation (HCC)    Chronic kidney disease    Depression    Diabetes mellitus without complication (HCC)    Dysrhythmia    Family history of adverse reaction to anesthesia    sister- problems waking up after anes. took several days to recover fully   Gallstones    GERD (gastroesophageal reflux disease)     Hepatic steatosis    History of hiatal hernia    Hyperlipidemia    IBS (irritable bowel syndrome)    Internal hemorrhoids    Irregular heart beat    Peptic ulcer disease    Rotator cuff disorder, left    TMJ (temporomandibular joint disorder)    Vitamin D deficiency    Past Surgical History:  Past Surgical History:  Procedure Laterality Date   ANAL RECTAL MANOMETRY N/A 11/22/2016   Procedure: ANO RECTAL MANOMETRY;  Surgeon: Gustav Shila GAILS, MD;  Location: [facility];  Service: Endoscopy;  Laterality: N/A;   BREAST BIOPSY Right 08/13/2022   MM RT BREAST BX W LOC DEV 1ST LESION IMAGE BX SPEC STEREO GUIDE 08/13/2022 [facility]   BREAST LUMPECTOMY Right    CHOLECYSTECTOMY     COLONOSCOPY     HAND SURGERY     3 hand surgeries, Left hand- trigger fingers x 2,  right hand x 1   KNEE ARTHROPLASTY Left 09/04/2022   Procedure: COMPUTER ASSISTED TOTAL KNEE ARTHROPLASTY;  Surgeon: Fidel Rogue, MD;  Location: [facility];  Service: Orthopedics;  Laterality: Left;  150   KNEE SURGERY     PARTIAL HYSTERECTOMY  1983   POLYPECTOMY     RECTOCELE AND CYSTOCELE REPAIR     TONSILLECTOMY      Social History:  reports  that she quit smoking about 40 years ago. Her smoking use included cigarettes. She has never used smokeless tobacco. She reports that she does not drink alcohol  and does not use drugs. Family History: family history includes Breast cancer in her sister; Colon cancer in her father, paternal aunt, and paternal uncle; Ovarian cancer in her mother; Uterine cancer in her mother.   HOME MEDICATIONS: Allergies as of 09/21/2024       Reactions   Amoxicillin-pot Clavulanate Nausea And Vomiting   None listed per [facility].   Penicillins Nausea And Vomiting   Patient denies        Medication List        Accurate as of September 21, 2024  1:23 PM. If you have any questions, ask your nurse or doctor.          STOP taking these medications     metoCLOPramide  10 MG tablet Commonly known as: Reglan  Stopped by: Donell Butts, MD   nitroGLYCERIN  0.4 MG SL tablet Commonly known as: NITROSTAT  Stopped by: Donell Butts, MD   omeprazole  40 MG capsule Commonly known as: PRILOSEC Stopped by: Donell Butts, MD       TAKE these medications    apixaban  5 MG Tabs tablet Commonly known as: ELIQUIS  Take 5 mg by mouth 2 (two) times daily.   citalopram 20 MG tablet Commonly known as: CELEXA Take by mouth.   clindamycin 300 MG capsule Commonly known as: CLEOCIN TAKE 2 CAPSULES BY MOUTH 1 HOUR PRIOR TO DENTAL APPOINTMENT   famotidine  20 MG tablet Commonly known as: PEPCID  Take 20-40 mg by mouth daily as needed.   furosemide  20 MG tablet Commonly known as: LASIX  Take 20 mg by mouth daily.   MAGNESIUM  PO Take 1 tablet by mouth daily as needed (leg cramps).   metFORMIN 850 MG tablet Commonly known as: GLUCOPHAGE Take 850 mg by mouth daily with breakfast.   methimazole  5 MG tablet Commonly known as: TAPAZOLE  Take 1 tablet (5 mg total) by mouth daily.   methocarbamol  500 MG tablet Commonly known as: ROBAXIN  TAKE 2 TABLETS BY MOUTH EVERY 6 TO 8 HOURS AS NEEDED   metoprolol  succinate 25 MG 24 hr tablet Commonly known as: TOPROL -XL Take 1 tablet (25 mg total) by mouth daily. What changed: how much to take   rosuvastatin  10 MG tablet Commonly known as: CRESTOR  Take 1 tablet (10 mg total) by mouth daily.   venlafaxine  XR 150 MG 24 hr capsule Commonly known as: EFFEXOR -XR Take 150 mg by mouth daily.          REVIEW OF SYSTEMS: A comprehensive ROS was conducted with the patient and is negative except as per HPI     OBJECTIVE:  VS: BP 110/60   Pulse 78   Ht 5' 5 (1.651 m)   Wt 182 lb (82.6 kg)   BMI 30.29 kg/m      Wt Readings from Last 3 Encounters:  09/21/24 182 lb (82.6 kg)  05/18/24 174 lb 3.2 oz (79 kg)  02/25/24 176 lb (79.8 kg)     EXAM: General: Pt appears well and is  in NAD  Neck:  Thyroid : No goiter or nodules appreciated.  Lungs: Clear with good BS bilat   Heart: Auscultation: RRR.  Extremities:  BL LE: No pretibial edema   Mental Status: Judgment, insight: Intact Orientation: Oriented to time, place, and person Mood and affect: No depression, anxiety, or agitation     DATA REVIEWED:  Latest Reference Range & Units 05/18/24 10:29  TSH 0.40 - 4.50 mIU/L 1.05  Triiodothyronine,Free,Serum 2.3 - 4.2 pg/mL 3.0  T4,Free(Direct) 0.8 - 1.8 ng/dL 1.0       Latest Reference Range & Units 06/25/23 14:38  TRAB <=2.00 IU/L <1.00   Thyroid  ultrasound 06/27/2023  Estimated total number of nodules >/= 1 cm: 6-10   Number of spongiform nodules >/=  2 cm not described below (TR1): 0   Number of mixed cystic and solid nodules >/= 1.5 cm not described below (TR2): 0   _________________________________________________________   There are multiple, nearly innumerable, mixed cystic and solid, spongiform/benign-appearing and colloid containing nodules throughout the right lobe of the thyroid  the largest of which within the mid aspect of the right lobe of the thyroid  measuring 1.9 x 0.6 x 0.5 cm (labeled 3) though none of which meet imaging criteria to recommend percutaneous sampling or continued dedicated follow-up.   _________________________________________________________   Similar to the right lobe, there are multiple, nearly innumerable, mixed cystic and solid, spongiform/benign-appearing nodules throughout the left lobe of the thyroid , the largest of which measures 1.2 x 0.8 x 0.7 cm (labeled 7), and none of which meet criteria to recommend percutaneous sampling or continued dedicated follow-up.   IMPRESSION: 1. Findings compatible with multinodular goiter. 2. Multiple, nearly innumerable, mixed cystic and solid, spongiform/benign-appearing and colloid containing nodule throughout both the right and left lobes of the thyroid , none of  which meet imaging criteria to recommend percutaneous sampling or continued dedicated follow-up.   ASSESSMENT/PLAN/RECOMMENDATIONS:   Hyperthyroidism :  - Patient is clinically euthyroid except for weight gain -Most likely secondary to autonomous thyroid  nodules - Today we discussed options for definitive therapy including RAI as well as total thyroidectomy, I did explain to the patient for the lifelong need for LT-4 replacement with any of these treatment options   Medications : Continue methimazole  5 mg, 1 tablet daily   2.  Multinodular goiter:  -This is based on thyroid  ultrasound 06/2023, none of the nodules met FNA or follow-up criteria - No local neck symptoms   Follow-up in 4 months   Signed electronically by: Stefano Redgie Butts, MD  [facility] End[facility]  [facility] [address] [place] [place], [place] [address] Phone: [phone] FAX: [phone]   CC: Loring Tanda Mae, MD [address] [place] [address] Phone: [phone] Fax: [phone]   Return to Endocrinology clinic as below: No future appointments.         "

## 2024-09-21 NOTE — Patient Instructions (Signed)
 If you decide on radioactive iodine  treatment, we will order a 24 hour radioactive iodine  uptake test. If high-uptake hyperthyroidism is confirmed, we could then proceed with radioactive iodine  treatment. That requires exposure precautions for a few days after the treatment, and usually results in permanent hypothyroidism (underactive thyroid ), which would be expected to require lifelong thyroid  hormone replacement as a daily pill.

## 2024-09-22 ENCOUNTER — Ambulatory Visit: Payer: Self-pay | Admitting: Internal Medicine

## 2024-09-22 LAB — T4, FREE: Free T4: 1 ng/dL (ref 0.8–1.8)

## 2024-09-22 LAB — TSH: TSH: 0.72 m[IU]/L (ref 0.40–4.50)

## 2024-09-22 LAB — T3, FREE: T3, Free: 3.4 pg/mL (ref 2.3–4.2)

## 2025-01-19 ENCOUNTER — Ambulatory Visit: Admitting: Internal Medicine
